# Patient Record
Sex: Female | Born: 1960 | Race: Black or African American | Hispanic: No | Marital: Married | Smoking: Never smoker
Health system: Southern US, Community
[De-identification: ages and names within clinical notes are randomized; demographics above are authoritative.]

## PROBLEM LIST (undated history)

## (undated) DIAGNOSIS — D649 Anemia, unspecified: Secondary | ICD-10-CM

## (undated) DIAGNOSIS — I6523 Occlusion and stenosis of bilateral carotid arteries: Secondary | ICD-10-CM

## (undated) DIAGNOSIS — Z87442 Personal history of urinary calculi: Secondary | ICD-10-CM

## (undated) DIAGNOSIS — E0500 Thyrotoxicosis with diffuse goiter without thyrotoxic crisis or storm: Secondary | ICD-10-CM

## (undated) HISTORY — PX: OTHER SURGICAL HISTORY: SHX169

## (undated) HISTORY — DX: Anemia, unspecified: D64.9

## (undated) HISTORY — DX: Occlusion and stenosis of bilateral carotid arteries: I65.23

---

## 2018-01-31 DIAGNOSIS — R002 Palpitations: Secondary | ICD-10-CM | POA: Diagnosis not present

## 2018-01-31 DIAGNOSIS — Z1211 Encounter for screening for malignant neoplasm of colon: Secondary | ICD-10-CM | POA: Diagnosis not present

## 2018-01-31 DIAGNOSIS — Z6820 Body mass index (BMI) 20.0-20.9, adult: Secondary | ICD-10-CM | POA: Diagnosis not present

## 2018-01-31 DIAGNOSIS — Z01419 Encounter for gynecological examination (general) (routine) without abnormal findings: Secondary | ICD-10-CM | POA: Diagnosis not present

## 2018-02-03 DIAGNOSIS — Z1231 Encounter for screening mammogram for malignant neoplasm of breast: Secondary | ICD-10-CM | POA: Diagnosis not present

## 2018-02-08 DIAGNOSIS — M898X9 Other specified disorders of bone, unspecified site: Secondary | ICD-10-CM | POA: Diagnosis not present

## 2018-02-08 DIAGNOSIS — Z0001 Encounter for general adult medical examination with abnormal findings: Secondary | ICD-10-CM | POA: Diagnosis not present

## 2018-02-08 DIAGNOSIS — H93A9 Pulsatile tinnitus, unspecified ear: Secondary | ICD-10-CM | POA: Diagnosis not present

## 2018-02-08 DIAGNOSIS — M2550 Pain in unspecified joint: Secondary | ICD-10-CM | POA: Diagnosis not present

## 2018-02-08 DIAGNOSIS — R002 Palpitations: Secondary | ICD-10-CM | POA: Diagnosis not present

## 2018-02-08 DIAGNOSIS — I517 Cardiomegaly: Secondary | ICD-10-CM | POA: Diagnosis not present

## 2018-02-08 DIAGNOSIS — Z0000 Encounter for general adult medical examination without abnormal findings: Secondary | ICD-10-CM | POA: Diagnosis not present

## 2018-02-08 DIAGNOSIS — R000 Tachycardia, unspecified: Secondary | ICD-10-CM | POA: Diagnosis not present

## 2018-02-08 LAB — HEMOGLOBIN A1C: Hemoglobin A1C: 11

## 2018-02-21 DIAGNOSIS — R634 Abnormal weight loss: Secondary | ICD-10-CM | POA: Diagnosis not present

## 2018-02-21 DIAGNOSIS — Z1211 Encounter for screening for malignant neoplasm of colon: Secondary | ICD-10-CM | POA: Diagnosis not present

## 2018-02-21 DIAGNOSIS — K5904 Chronic idiopathic constipation: Secondary | ICD-10-CM | POA: Diagnosis not present

## 2018-02-21 DIAGNOSIS — D509 Iron deficiency anemia, unspecified: Secondary | ICD-10-CM | POA: Diagnosis not present

## 2018-02-24 ENCOUNTER — Ambulatory Visit (INDEPENDENT_AMBULATORY_CARE_PROVIDER_SITE_OTHER): Payer: BLUE CROSS/BLUE SHIELD | Admitting: Internal Medicine

## 2018-02-24 ENCOUNTER — Encounter: Payer: Self-pay | Admitting: Internal Medicine

## 2018-02-24 VITALS — BP 128/60 | HR 116 | Ht 64.0 in | Wt 119.0 lb

## 2018-02-24 DIAGNOSIS — E559 Vitamin D deficiency, unspecified: Secondary | ICD-10-CM

## 2018-02-24 DIAGNOSIS — E0500 Thyrotoxicosis with diffuse goiter without thyrotoxic crisis or storm: Secondary | ICD-10-CM | POA: Insufficient documentation

## 2018-02-24 DIAGNOSIS — E0590 Thyrotoxicosis, unspecified without thyrotoxic crisis or storm: Secondary | ICD-10-CM | POA: Diagnosis not present

## 2018-02-24 LAB — TSH: TSH: <0.01 u[IU]/mL — ABNORMAL LOW (ref 0.35–4.50)

## 2018-02-24 LAB — T4, FREE: Free T4: 3.34 ng/dL — ABNORMAL HIGH (ref 0.60–1.60)

## 2018-02-24 LAB — T3, FREE: T3, Free: 17 pg/mL — ABNORMAL HIGH (ref 2.3–4.2)

## 2018-02-24 MED ORDER — ATENOLOL 25 MG PO TABS: 25.0000 mg | ORAL_TABLET | Freq: Every day | ORAL | 3 refills | Status: DC

## 2018-02-24 MED ORDER — ATENOLOL 25 MG PO TABS: 25.0000 mg | ORAL_TABLET | Freq: Every day | ORAL | 1 refills | Status: DC

## 2018-02-24 NOTE — Progress Notes (Signed)
Patient ID: Sandra Murray, female   DOB: 1960/11/28, 57 y.o.   MRN: [id number]    HPI  Sandra Murray is a 57 y.o.-year-old female, referred by her gastroenterologist, Dr. Loreta Ave, for evaluation for thyrotoxicosis.  She started to have some  weight loss and bone pain >> presented to see PCP  - TSH was undetectable. She was sent to a rheumatologist for suspicion for RA >> testing was negative.  No intervention was suggested for her abnormal thyroid tests then  4 mo ago she started to have: - palpitations - whooshing sound in year - lost weight (also very stressed as she is taking care of her father who has dementia) - tremors - anxiety - insomnia  She presented to see her current PCP (Dr. Leavy Cella) >> TSH was again undetectable. Again, no intervention was suggested.  She recently presented to see Dr. Loreta Ave for a screening colonoscopy >> Dr. Loreta Ave referred her to me urgently for her thyrotoxicosis.  I reviewed pt's thyroid tests per records from Dr. Loreta Ave: 02/08/2018: TSH <0.010 12/15/2016: TSH <0.010 12/18/2015: TSH 2.192 No results found for: TSH, FREET4, T3FREE  Antithyroid antibodies: No results found for: TSI  Pt denies: - feeling nodules in neck - dysphagia - choking - SOB with lying down She does have hoarseness.  Pt does not have a FH of thyroid ds. No FH of thyroid cancer. No h/o radiation tx to head or neck.  No seaweed or kelp, no recent contrast studies. No steroid use. No herbal supplements. No Biotin use.  I reviewed patient's latest CBC and she had a normal white blood cell count, at 7.6.  In 12/15/2016, this was also normal, at 6.2.  Of note, she also has a history of vitamin deficiency, with a level of 14 on 02/08/2018 and a level of 12 on 12/18/2015.  She also has ah/o anemia.  ROS: Constitutional: + see HPI Eyes: no blurry vision, no xerophthalmia ENT: no sore throat, + see HPI Cardiovascular: no CP/SOB/+ palpitations/no leg swelling Respiratory: no  cough/SOB Gastrointestinal: no N/V/D/C Musculoskeletal: + muscle aches/no joint aches Skin: no rashes Neurological: + tremors/no numbness/tingling/dizziness Psychiatric: no depression/+ anxiety  History reviewed. No pertinent past medical history.   History reviewed. No pertinent surgical history.   Social History   Socioeconomic History  . Marital status: Married    Spouse name: Not on file  . Number of children: 0  . Years of education: Not on file  . 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 Smoking status: Never Smoker  . Smokeless tobacco: Never Used  Substance and Sexual Activity  . Alcohol use: No  . Drug use: No   Not on any meds or supplements.  No Known Allergies   History reviewed. No pertinent family history.  Mother  - heart ds Father  - Prostate cancer  PE: BP 128/60   Pulse (!) 116   Ht 5\' 4"  (1.626 m) Comment: measured  Wt 119 lb (54 kg)   SpO2 98%   BMI 20.43 kg/m  Wt Readings from Last 3 Encounters:  02/24/18 119 lb (54 kg)   Constitutional:normal weight, in NAD Eyes: PERRLA, EOMI, no exophthalmos, no lid lag, no stare ENT: moist mucous membranes, no thyromegaly, no thyroid bruits, no cervical lymphadenopathy Cardiovascular: tachycardia, RR, No MRG Respiratory: CTA B Gastrointestinal: abdomen soft, NT, ND, BS+ Musculoskeletal: no deformities, strength intact in all 4 Skin: moist, warm, no rashes  Neurological: + tremor with outstretched hands, DTR normal in all 4  ASSESSMENT: 1. Thyrotoxicosis  2. Vitamin D def  PLAN:  1. Patient with a recently found low TSH, with thyrotoxic sxs: weight loss, palpitations, tremors, anxiety.  She has been thyrotoxic for at least 1 year and 3 months per review of labs obtained in 01/2018 and 11/2016.   - she does not appear to have exogenous causes for the low TSH.  - We discussed that possible causes of thyrotoxicosis are:  Marland Kitchen Graves ds    . Thyroiditis . toxic multinodular goiter/ toxic adenoma (I cannot feel nodules at palpation of her thyroid). - will check the TSH, fT3 and fT4 and also add thyroid stimulating antibodies to screen for Graves' disease.  - If the tests remain abnormal, we may need an uptake and scan to differentiate between the 3 above possible etiologies  - we discussed about possible modalities of treatment for the above conditions, to include methimazole use, radioactive iodine ablation or (last resort) surgery.  She is reticent about treatment since she was previously healthy and did not have to take medications for anything in the past. We discussed about possible dangers of persisting thyrotoxicosis: Arrhythmia, heart failure, hypercoagulability with the risk of stroke, PE, MI. - I explained we may need to do thyroid ultrasound depending on the results of the uptake and scan (if a cold nodule is present) >> for now, I advised to cancel the U/S suggested by PCP. - I suggested to add beta blockers (Atenolol) as she is tachycardic or tremulous. She refuses it for now as she would like to research the medication - she is reticent to start any med since she is not used to take any medications.  - she declined to join My chart - RTC in 3 months, but likely sooner for repeat labs  2. Vitamin D def - she was advised to start Ergocalciferol by PCP  - she did not start pending labs today - I advised her that she can start the vitamin D supplement   Addendum: Patient returned later in the day with her husband and wanted the atenolol sent to her pharmacy as she decided to start to take it right away.  Component     Latest Ref Rng & Units 02/24/2018  TSH     0.35 - 4.50 uIU/mL <0.01 Repeated and verified X2. (L)  T4,Free(Direct)     0.60 - 1.60 ng/dL 6.60 (H)  Triiodothyronine,Free,Serum     2.3 - 4.2 pg/mL 17.0 (H)  TSI     <140 % baseline 504 (H)   TFTs are in the thyrotoxic range and her TSI's are elevated  pointing towards Graves' disease as the etiology of her thyrotoxicosis.  We will start methimazole 10 mg twice a day.  I will have her back in 4 to 5 weeks for repeat labs.  Carlus Pavlov, MD PhD [facility]

## 2018-02-24 NOTE — Patient Instructions (Addendum)
Please stop at the lab.  Please start Atenolol 25 mg 1x a day at bedtime. Please increase the dose to 2x a day if the heart rate is >90 at home, at rest.  I will let you know about the results as soon as they return.  Please come back for a follow-up appointment in 3 months.   Hyperthyroidism Hyperthyroidism is when the thyroid is too active (overactive). Your thyroid is a large gland that is located in your neck. The thyroid helps to control how your body uses food (metabolism). When your thyroid is overactive, it produces too much of a hormone called thyroxine. What are the causes? Causes of hyperthyroidism may include:  Graves disease. This is when your immune system attacks the thyroid gland. This is the most common cause.  Inflammation of the thyroid gland.  Tumor in the thyroid gland or somewhere else.  Excessive use of thyroid medicines, including: ? Prescription thyroid supplement. ? Herbal supplements that mimic thyroid hormones.  Solid or fluid-filled lumps within your thyroid gland (thyroid nodules).  Excessive ingestion of iodine.  What increases the risk?  Being female.  Having a family history of thyroid conditions. What are the signs or symptoms? Signs and symptoms of hyperthyroidism may include:  Nervousness.  Inability to tolerate heat.  Unexplained weight loss.  Diarrhea.  Change in the texture of hair or skin.  Heart skipping beats or making extra beats.  Rapid heart rate.  Loss of menstruation.  Shaky hands.  Fatigue.  Restlessness.  Increased appetite.  Sleep problems.  Enlarged thyroid gland or nodules.  How is this diagnosed? Diagnosis of hyperthyroidism may include:  Medical history and physical exam.  Blood tests.  Ultrasound tests.  How is this treated? Treatment may include:  Medicines to control your thyroid.  Surgery to remove your thyroid.  Radiation therapy.  Follow these instructions at home:  Take  medicines only as directed by your health care provider.  Do not use any tobacco products, including cigarettes, chewing tobacco, or electronic cigarettes. If you need help quitting, ask your health care provider.  Do not exercise or do physical activity until your health care provider approves.  Keep all follow-up appointments as directed by your health care provider. This is important. Contact a health care provider if:  Your symptoms do not get better with treatment.  You have fever.  You are taking thyroid replacement medicine and you: ? Have depression. ? Feel mentally and physically slow. ? Have weight gain. Get help right away if:  You have decreased alertness or a change in your awareness.  You have abdominal pain.  You feel dizzy.  You have a rapid heartbeat.  You have an irregular heartbeat. This information is not intended to replace advice given to you by your health care provider. Make sure you discuss any questions you have with your health care provider. Document Released: 03/08/2005 Document Revised: 08/07/2015 Document Reviewed: 07/24/2013 Elsevier Interactive Patient Education  2018 [employer]

## 2018-02-27 ENCOUNTER — Telehealth: Payer: Self-pay | Admitting: Internal Medicine

## 2018-02-27 LAB — THYROID STIMULATING IMMUNOGLOBULIN: TSI: 504 % baseline — ABNORMAL HIGH (ref <140)

## 2018-02-27 MED ORDER — METHIMAZOLE 10 MG PO TABS: 10.0000 mg | ORAL_TABLET | Freq: Two times a day (BID) | ORAL | 1 refills | Status: DC

## 2018-02-27 NOTE — Telephone Encounter (Signed)
Patient has called inquiring about her lab results. Please Advise, thanks

## 2018-02-27 NOTE — Telephone Encounter (Signed)
Which is started atenolol which helps with her tachycardia.  If she persists in having tachycardia, she needs to increase the dose, as we discussed, to twice a day.  I am sure the antibodies will be back very soon.

## 2018-02-27 NOTE — Telephone Encounter (Signed)
The TSH is very low and the free T4 and free T3 are quite high.  I am still waiting for the Graves' antibodies to return.  I will let you know as soon as these return, to decide about further plan.

## 2018-02-27 NOTE — Telephone Encounter (Signed)
Called pt and made her aware. She is concerned. States during her last visit with you, sh had experienced some tachycardia. Would like to know if treatment for tachycardia is also on hold pending the Graves' results? Please advise.

## 2018-02-28 ENCOUNTER — Other Ambulatory Visit: Payer: Self-pay | Admitting: Internal Medicine

## 2018-02-28 ENCOUNTER — Other Ambulatory Visit: Payer: Self-pay

## 2018-02-28 ENCOUNTER — Telehealth: Payer: Self-pay

## 2018-02-28 NOTE — Telephone Encounter (Signed)
Please see prev. msg - Atenolol is for decreasing HR. She already started this. Can increase to bid if still high HR.

## 2018-02-28 NOTE — Telephone Encounter (Signed)
Made patient aware of MD advice and she stated an understanding

## 2018-02-28 NOTE — Telephone Encounter (Signed)
Gave patient results and she stated an understanding and agrees to this treatment-patient also reported elevated heart rate which she had the understanding Dr. Elvera LennoxGherghe was going to prescribe something for please advise

## 2018-02-28 NOTE — Telephone Encounter (Signed)
-----   Message from Carlus Pavlovristina Gherghe, MD sent at 02/27/2018  5:36 PM EST ----- Efraim KaufmannMelissa, can you please call pt: Patient's Graves' antibodies are back and are elevated.  It does appear that she has Graves' disease, the autoimmune disease that affects her thyroid.  In this case, my recommendation is to start methimazole 10 mg twice a day for the next month and then come back for labs.  I will order these.  I will also add methimazole to her list but I will not send it until you call her in the morning. Please advise her of the following: Please stop the Methimazole and call us or your primary care doctor if you develop: - sore throat - fever - yellow skin - dark urine - light colored stools As we will then need to check your blood counts and liver tests do to the possibility of a very rare side effect.

## 2018-02-28 NOTE — Telephone Encounter (Signed)
Made patient aware her medications were sent to her preferred pharmacy

## 2018-02-28 NOTE — Telephone Encounter (Signed)
Patient called upset stating the Nurse she spoke with this morning told her she would be filling her medication and did not do so. Patient was unable to provide name of RX. Please Advise, thanks

## 2018-03-01 MED ORDER — ATENOLOL 25 MG PO TABS: 25.0000 mg | ORAL_TABLET | Freq: Every day | ORAL | 1 refills | Status: DC

## 2018-03-01 MED ORDER — METHIMAZOLE 10 MG PO TABS: 10.0000 mg | ORAL_TABLET | Freq: Two times a day (BID) | ORAL | 1 refills | Status: DC

## 2018-03-01 NOTE — Telephone Encounter (Signed)
The medications were ordered, however instead of them being sent electronically to pharmacy they were ordered as "no print" which means they not only do not print but they also do not go to the pharmacy.  Medication has been sent.  Patient notified.

## 2018-03-07 DIAGNOSIS — D509 Iron deficiency anemia, unspecified: Secondary | ICD-10-CM | POA: Diagnosis not present

## 2018-03-07 DIAGNOSIS — R002 Palpitations: Secondary | ICD-10-CM | POA: Diagnosis not present

## 2018-03-07 DIAGNOSIS — E0500 Thyrotoxicosis with diffuse goiter without thyrotoxic crisis or storm: Secondary | ICD-10-CM | POA: Diagnosis not present

## 2018-03-08 DIAGNOSIS — R000 Tachycardia, unspecified: Secondary | ICD-10-CM | POA: Diagnosis not present

## 2018-03-08 DIAGNOSIS — R002 Palpitations: Secondary | ICD-10-CM | POA: Diagnosis not present

## 2018-03-09 ENCOUNTER — Ambulatory Visit (INDEPENDENT_AMBULATORY_CARE_PROVIDER_SITE_OTHER): Payer: BLUE CROSS/BLUE SHIELD | Admitting: Neurology

## 2018-03-09 ENCOUNTER — Encounter: Payer: Self-pay | Admitting: Neurology

## 2018-03-09 VITALS — BP 144/67 | HR 82 | Ht 64.0 in | Wt 121.0 lb

## 2018-03-09 DIAGNOSIS — E0590 Thyrotoxicosis, unspecified without thyrotoxic crisis or storm: Secondary | ICD-10-CM

## 2018-03-09 DIAGNOSIS — R0989 Other specified symptoms and signs involving the circulatory and respiratory systems: Secondary | ICD-10-CM | POA: Diagnosis not present

## 2018-03-09 NOTE — Progress Notes (Signed)
Reason for visit: Thyroid toxicosis  Referring physician: Dr. Lorin Glassillard  Sandra Murray is a 57 y.o. female  History of present illness:  Sandra Murray is a 57 year old right-handed black female with a history of hyperthyroidism.  The patient has been seen through endocrinology and she has been felt to have Graves' disease.  The patient has noted some issues over the last year or 2, she has had some gradual weight loss, she has lost 18 pounds in the last year.  The patient has had some fine tremors of the hands, she has had elevated heart rate and palpitations of the heart.  She has denied any weakness but she has fallen on occasion.  She denies significant issues controlling the bowels or the bladder but she has had one event of bowel and bladder incontinence.  The patient denies headaches or dizziness.  She does report some pulsatile tinnitus that has been noted since September 2019.  The patient was found to have a very low TSH and elevated T4 and T3 levels.  She is now followed through endocrinology, on methimazole.  The patient is sent to this office for an evaluation.  She has already been set up for a carotid Doppler study.  History reviewed. No pertinent past medical history.  History reviewed. No pertinent surgical history.  Family History  Problem Relation Age of Onset  . Stroke Mother   . Heart disease Mother   . Alzheimer's disease Father     Social history:  reports that she has never smoked. She has never used smokeless tobacco. She reports that she does not drink alcohol or use drugs.  Medications:  Prior to Admission medications   Medication Sig Start Date End Date Taking? Authorizing Provider  atenolol (TENORMIN) 25 MG tablet Take 1 tablet (25 mg total) by mouth daily. 03/01/18  Yes Carlus PavlovGherghe, Cristina, MD  ergocalciferol (VITAMIN D2) 1.25 MG (50000 UT) capsule Take 50,000 Units by mouth once a week. 1 weekly for 3 months   Yes [provider]  methimazole  (TAPAZOLE) 10 MG tablet Take 1 tablet (10 mg total) by mouth 2 (two) times daily. With meals. 03/01/18  Yes Carlus PavlovGherghe, Cristina, MD     No Known Allergies  ROS:  Out of a complete 14 system review of symptoms, the patient complains only of the following symptoms, and all other reviewed systems are negative.  Weight loss Palpitations of the heart Shortness of breath Constipation Anemia Joint pain, aching muscles Tremor Not enough sleep, insomnia  Blood pressure (!) 144/67, pulse 82, height 5\' 4"  (1.626 m), weight 121 lb (54.9 kg).  Physical Exam  General: The patient is alert and cooperative at the time of the examination.  Eyes: Pupils are equal, round, and reactive to light. Discs are flat bilaterally.  Neck: The neck is supple, bilateral carotid bruits are noted.  Respiratory: The respiratory examination is clear.  Cardiovascular: The cardiovascular examination reveals a regular rate and rhythm, no obvious murmurs or rubs are noted.  Skin: Extremities are without significant edema.  Neurologic Exam  Mental status: The patient is alert and oriented x 3 at the time of the examination. The patient has apparent normal recent and remote memory, with an apparently normal attention span and concentration ability.  Cranial nerves: Facial symmetry is present. There is good sensation of the face to pinprick and soft touch bilaterally. The strength of the facial muscles and the muscles to head turning and shoulder shrug are normal bilaterally. Speech is  well enunciated, no aphasia or dysarthria is noted. Extraocular movements are full. Visual fields are full. The tongue is midline, and the patient has symmetric elevation of the soft palate. No obvious hearing deficits are noted.  Motor: The motor testing reveals 5 over 5 strength of all 4 extremities, with exception of slight weakness of the triceps muscles and with hip flexion bilaterally. Good symmetric motor tone is noted  throughout.  Sensory: Sensory testing is intact to pinprick, soft touch, vibration sensation, and position sense on all 4 extremities. No evidence of extinction is noted.  Coordination: Cerebellar testing reveals good finger-nose-finger and heel-to-shin bilaterally.  A slight elevation the physiologic tremor is noted.  Gait and station: Gait is normal. Tandem gait is normal. Romberg is negative. No drift is seen.  Reflexes: Deep tendon reflexes are symmetric and normal bilaterally. Toes are downgoing bilaterally.   Assessment/Plan:  1.  Hyperthyroidism, Graves' disease  2.  Bilateral carotid bruits  The patient has already been set up for a carotid Doppler study.  The patient likely has bilateral carotid bruits associated with an elevation in cardiac output, this may resolve with full treatment of the hyperthyroidism.  The patient will follow-up through this office if needed.  Marlan Palau. Keith  MD 03/09/2018 8:57 AM  [facility] [address] [place], [place] [id number]  Phone [phone] Fax [phone]

## 2018-03-10 DIAGNOSIS — R000 Tachycardia, unspecified: Secondary | ICD-10-CM | POA: Diagnosis not present

## 2018-03-10 DIAGNOSIS — R002 Palpitations: Secondary | ICD-10-CM | POA: Diagnosis not present

## 2018-04-18 DIAGNOSIS — R7989 Other specified abnormal findings of blood chemistry: Secondary | ICD-10-CM | POA: Diagnosis not present

## 2018-04-21 DIAGNOSIS — D509 Iron deficiency anemia, unspecified: Secondary | ICD-10-CM | POA: Diagnosis not present

## 2018-04-21 DIAGNOSIS — I10 Essential (primary) hypertension: Secondary | ICD-10-CM | POA: Diagnosis not present

## 2018-05-18 DIAGNOSIS — D509 Iron deficiency anemia, unspecified: Secondary | ICD-10-CM | POA: Diagnosis not present

## 2018-05-18 DIAGNOSIS — Z1211 Encounter for screening for malignant neoplasm of colon: Secondary | ICD-10-CM | POA: Diagnosis not present

## 2018-05-18 DIAGNOSIS — R194 Change in bowel habit: Secondary | ICD-10-CM | POA: Diagnosis not present

## 2018-06-07 ENCOUNTER — Ambulatory Visit: Payer: BLUE CROSS/BLUE SHIELD | Admitting: Internal Medicine

## 2018-06-08 ENCOUNTER — Other Ambulatory Visit: Payer: Self-pay

## 2018-06-09 ENCOUNTER — Ambulatory Visit (INDEPENDENT_AMBULATORY_CARE_PROVIDER_SITE_OTHER): Payer: 59 | Admitting: Internal Medicine

## 2018-06-09 ENCOUNTER — Encounter: Payer: Self-pay | Admitting: Internal Medicine

## 2018-06-09 ENCOUNTER — Other Ambulatory Visit: Payer: Self-pay

## 2018-06-09 VITALS — BP 130/70 | HR 64 | Temp 98.1°F | Ht 64.0 in | Wt 133.0 lb

## 2018-06-09 DIAGNOSIS — E0500 Thyrotoxicosis with diffuse goiter without thyrotoxic crisis or storm: Secondary | ICD-10-CM | POA: Diagnosis not present

## 2018-06-09 DIAGNOSIS — E0590 Thyrotoxicosis, unspecified without thyrotoxic crisis or storm: Secondary | ICD-10-CM

## 2018-06-09 LAB — TSH: TSH: 46.86 u[IU]/mL — ABNORMAL HIGH (ref 0.35–4.50)

## 2018-06-09 LAB — T3, FREE: T3, Free: 2 pg/mL — ABNORMAL LOW (ref 2.3–4.2)

## 2018-06-09 LAB — T4, FREE: Free T4: <0.25 ng/dL — ABNORMAL LOW (ref 0.60–1.60)

## 2018-06-09 MED ORDER — METHIMAZOLE 5 MG PO TABS: 5.0000 mg | ORAL_TABLET | Freq: Every day | ORAL | 5 refills | Status: DC

## 2018-06-09 NOTE — Patient Instructions (Addendum)
Please stop at the lab.  Please continue: - Methimazole 10 mg 2x a day with meals  Please decrease: - Atenolol to 12mg  1x a day at bedtime - for 2 weeks. If your pulse stays <90 at rest, stop Atenolol completely  Please come back for a follow-up appointment in 4 months.

## 2018-06-09 NOTE — Progress Notes (Signed)
Patient ID: Bissie Dingee, female   DOB: July 14, 1960, 58 y.o.   MRN: [id number]    HPI  Cheyeanne Jarman Manson Passey is a 58 y.o.-year-old female, initially referred by her gastroenterologist, Dr. Loreta Ave, now returning for follow-up for Graves' disease.  Reviewed and addended history: She started to have some  weight loss and bone pain in 2018 >> presented to see PCP  - TSH was undetectable. She was sent to a rheumatologist for suspicion for RA >> testing was negative.  No intervention was suggested for her abnormal thyroid tests then.  In 10/2017, she started to have: - palpitations - whooshing sound in year - lost weight (also very stressed as she is taking care of her father who has dementia) - tremors - anxiety - insomnia  She presented to see her current PCP (Dr. Leavy Cella) >> TSH was again undetectable.  Again, no intervention was suggested.  She then saw Dr. Loreta Ave for a screening colonoscopy >> Dr. Loreta Ave referred her to me urgently for her thyrotoxicosis.  I reviewed patient's thyroid tests per epic records and also records from Dr. Loreta Ave: Lab Results  Component Value Date   TSH <0.01 Repeated and verified X2. (L) 02/24/2018   FREET4 3.34 (H) 02/24/2018   T3FREE 17.0 (H) 02/24/2018  02/08/2018: TSH <0.010 12/15/2016: TSH <0.010 12/18/2015: TSH 2.192  Her Graves' antibodies were elevated: Lab Results  Component Value Date   TSI 504 (H) 02/24/2018   At last visit, we started: - MMI 10 mg 2x a day - Atenolol 25 mg daily  She did not return for repeat TFTs 1 month after starting the medication (miscommunication?).  However, she tells me that she is feeling much better after starting the medications.  She gained weight (intentional), does not have the whooshing sound in her ears, no palpitations, no tremors.  Pt denies: - feeling nodules in neck - hoarseness - dysphagia - choking - SOB with lying down  Pt does not have a FH of thyroid ds. No FH of thyroid cancer. No h/o radiation  tx to head or neck.  No recent contrast studies. No herbal supplements. No Biotin use. No recent steroids use.   I reviewed patient's latest CBC and she had a normal white blood cell count, at 7.6.  In 12/15/2016, this was also normal, at 6.2.  Of note, she also has a history of vitamin deficiency, with a level of 14 on 02/08/2018 and a level of 12 on 12/18/2015.  She also has a history of anemia.  ROS: Constitutional: + weight gain/no weight loss, no fatigue, no subjective hyperthermia, no subjective hypothermia Eyes: no blurry vision, no xerophthalmia ENT: no sore throat, + see HPI Cardiovascular: no CP/no SOB/no palpitations/no leg swelling Respiratory: no cough/no SOB/no wheezing Gastrointestinal: no N/no V/no D/no C/no acid reflux Musculoskeletal: no muscle aches/no joint aches Skin: no rashes, no hair loss Neurological: no tremors/no numbness/no tingling/no dizziness  I reviewed pt's medications, allergies, PMH, social hx, family hx, and changes were documented in the history of present illness. Otherwise, unchanged from my initial visit note. PMH: -See above  No past surgical history  Social History   Socioeconomic History  . Marital status: Married    Spouse name: Not on file  . Number of children: 0  . Years of education: Not on file  . 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 Smoking status: Never Smoker  . Smokeless tobacco: Never Used  Substance and Sexual Activity  . Alcohol use: No  . Drug use: No   Current Outpatient Medications  Medication Sig Dispense Refill  . ergocalciferol (VITAMIN D2) 1.25 MG (50000 UT) capsule Take 50,000 Units by mouth once a week. 1 weekly for 3 months    . methimazole (TAPAZOLE) 10 MG tablet Take 1 tablet (10 mg total) by mouth 2 (two) times daily. With meals. 120 tablet 1   No current facility-administered medications for this visit.     No Known Allergies    Family History  Problem Relation Age of Onset  . Stroke Mother   . Heart disease Mother   . Alzheimer's disease Father     Mother  - heart ds Father  - Prostate cancer  PE: BP 130/70   Pulse 64   Temp 98.1 F (36.7 C)   Ht 5\' 4"  (1.626 m)   Wt 133 lb (60.3 kg)   SpO2 99%   BMI 22.83 kg/m  Wt Readings from Last 3 Encounters:  06/09/18 133 lb (60.3 kg)  03/09/18 121 lb (54.9 kg)  02/24/18 119 lb (54 kg)   Constitutional: Normal weight, in NAD Eyes: PERRLA, EOMI, no exophthalmos, no lid lag, no stare ENT: moist mucous membranes, no thyromegaly, no cervical lymphadenopathy Cardiovascular: RRR, No MRG Respiratory: CTA B Gastrointestinal: abdomen soft, NT, ND, BS+ Musculoskeletal: no deformities, strength intact in all 4 Skin: moist, warm, no rashes Neurological: no tremor with outstretched hands, DTR normal in all 4  ASSESSMENT: 1. Thyrotoxicosis  PLAN:  1. Patient with a history of a low TSH with thyrotoxic symptoms: Weight loss, palpitations, tremors, anxiety.  She has had thyrotoxic TFTs for at least 1 year and 3 months per review of the chart when I last saw her, first occurrence 11/2016. -At last visit, I explained the consequences of uncontrolled thyrotoxicosis: Arrhythmia, heart failure, hypercoagulability with higher risk of stroke, PE, MI.  I strongly suggested that we get this under control. -TSI antibodies were elevated pointing towards a diagnosis of Graves' disease. -We started her on 10 mg of methimazole 2x a day and also added atenolol 25 mg daily as she was tachycardic and tremulous.  However, she did not return for repeat labs in 1 month (unclear if there was initial miscommunication between our office and the patient).  However, she is feeling very well on the above regimen and she is compliant with the recommended doses.  Specifically, she does not have palpitations, tremors, she gained 12 pounds, which was intentional, has more energy, and overall feels  much better. -We discussed about her diagnosis of Graves' disease.  I explained that this is an autoimmune disease that stimulates her thyroid to over produce hormones. - possible modalities for treatment to include continuing methimazole and adjusting the dose down hopefully, RAI treatment, or, last resort, thyroidectomy.  For now, will continue methimazole -At today's visit, we will check her TFTs and adjust the medication dose as needed.  As her pulse is 64 today, we discussed about reducing the atenolol to 12.5 mg daily for now and stop after 2 weeks if her pulse is normal  - time spent with the patient: 25 minutes, of which >50% was spent in obtaining information about her symptoms, reviewing her previous labs, evaluations, and treatments, counseling her about her condition (please see the discussed topics above), and developing a plan to further investigate and treat it; she had a number of questions which I addressed.  Component     Latest  Ref Rng & Units 06/09/2018  TSH     0.35 - 4.50 uIU/mL 46.86 (H)  T4,Free(Direct)     0.60 - 1.60 ng/dL <8.65 (L)  Triiodothyronine,Free,Serum     2.3 - 4.2 pg/mL 2.0 (L)  Tests are now in the hypothyroid range - probably 2/2 longer time on the higher MMI dose >> will decrease the methimazole to 5 mg daily and have her back for labs in 4-5 weeks.   Carlus Pavlov, MD PhD [facility] Endocrinology

## 2018-06-27 ENCOUNTER — Other Ambulatory Visit (INDEPENDENT_AMBULATORY_CARE_PROVIDER_SITE_OTHER): Payer: 59

## 2018-06-27 ENCOUNTER — Other Ambulatory Visit: Payer: Self-pay

## 2018-06-27 ENCOUNTER — Other Ambulatory Visit: Payer: Self-pay | Admitting: Internal Medicine

## 2018-06-27 DIAGNOSIS — E0500 Thyrotoxicosis with diffuse goiter without thyrotoxic crisis or storm: Secondary | ICD-10-CM

## 2018-06-27 LAB — T3, FREE: T3, Free: 2.4 pg/mL (ref 2.3–4.2)

## 2018-06-27 LAB — TSH: TSH: 45.41 u[IU]/mL — ABNORMAL HIGH (ref 0.35–4.50)

## 2018-06-27 LAB — T4, FREE: Free T4: <0.25 ng/dL — ABNORMAL LOW (ref 0.60–1.60)

## 2018-07-17 ENCOUNTER — Telehealth: Payer: Self-pay | Admitting: Internal Medicine

## 2018-07-17 NOTE — Telephone Encounter (Signed)
M, please have her come for labs - I had to stop her MMI recently b/c she became hypothyroid but let's see if the tests changed. Ty!

## 2018-07-17 NOTE — Telephone Encounter (Signed)
Patient states Dr.Gherghe had her stop taking methimazole 5MG  and atenolol half of and now her heart rate is over 90 and the muscle aches are back.  Please Advise, Thanks

## 2018-07-19 NOTE — Telephone Encounter (Signed)
Called pt and made her aware. Scheduled for lab draw 07/21/18

## 2018-07-21 ENCOUNTER — Other Ambulatory Visit: Payer: Self-pay

## 2018-07-21 ENCOUNTER — Other Ambulatory Visit (INDEPENDENT_AMBULATORY_CARE_PROVIDER_SITE_OTHER): Payer: 59

## 2018-07-21 DIAGNOSIS — E0500 Thyrotoxicosis with diffuse goiter without thyrotoxic crisis or storm: Secondary | ICD-10-CM

## 2018-07-21 LAB — T4, FREE: Free T4: 0.75 ng/dL (ref 0.60–1.60)

## 2018-07-21 LAB — T3, FREE: T3, Free: 4.7 pg/mL — ABNORMAL HIGH (ref 2.3–4.2)

## 2018-07-21 LAB — TSH: TSH: 0.47 u[IU]/mL (ref 0.35–4.50)

## 2018-07-25 ENCOUNTER — Other Ambulatory Visit: Payer: Self-pay | Admitting: Internal Medicine

## 2018-07-25 ENCOUNTER — Encounter: Payer: Self-pay | Admitting: Internal Medicine

## 2018-07-25 DIAGNOSIS — E0500 Thyrotoxicosis with diffuse goiter without thyrotoxic crisis or storm: Secondary | ICD-10-CM

## 2018-07-26 ENCOUNTER — Other Ambulatory Visit: Payer: Self-pay | Admitting: Internal Medicine

## 2018-07-26 MED ORDER — METHIMAZOLE 5 MG PO TABS: 5.0000 mg | ORAL_TABLET | Freq: Every day | ORAL | 3 refills | Status: DC

## 2018-07-27 ENCOUNTER — Other Ambulatory Visit: Payer: Self-pay | Admitting: Internal Medicine

## 2018-07-27 MED ORDER — METHIMAZOLE 5 MG PO TABS: 5.0000 mg | ORAL_TABLET | Freq: Every day | ORAL | 3 refills | Status: DC

## 2018-08-16 ENCOUNTER — Other Ambulatory Visit: Payer: Self-pay | Admitting: Internal Medicine

## 2018-08-24 ENCOUNTER — Telehealth: Payer: Self-pay | Admitting: Internal Medicine

## 2018-08-24 NOTE — Telephone Encounter (Signed)
Patient called re: patient is seeing double. Instructed patient to call her Opthalmologist-per Dr. Elvera Lennox

## 2018-10-05 ENCOUNTER — Other Ambulatory Visit: Payer: Self-pay

## 2018-10-09 ENCOUNTER — Encounter: Payer: Self-pay | Admitting: Internal Medicine

## 2018-10-09 ENCOUNTER — Ambulatory Visit (INDEPENDENT_AMBULATORY_CARE_PROVIDER_SITE_OTHER): Payer: 59 | Admitting: Internal Medicine

## 2018-10-09 ENCOUNTER — Other Ambulatory Visit: Payer: Self-pay

## 2018-10-09 VITALS — BP 130/70 | HR 78 | Ht 64.0 in | Wt 135.4 lb

## 2018-10-09 DIAGNOSIS — E559 Vitamin D deficiency, unspecified: Secondary | ICD-10-CM

## 2018-10-09 DIAGNOSIS — E0500 Thyrotoxicosis with diffuse goiter without thyrotoxic crisis or storm: Secondary | ICD-10-CM

## 2018-10-09 LAB — VITAMIN D 25 HYDROXY (VIT D DEFICIENCY, FRACTURES): VITD: 28.27 ng/mL — ABNORMAL LOW (ref 30.00–100.00)

## 2018-10-09 LAB — T4, FREE: Free T4: 0.4 ng/dL — ABNORMAL LOW (ref 0.60–1.60)

## 2018-10-09 LAB — TSH: TSH: 15.86 u[IU]/mL — ABNORMAL HIGH (ref 0.35–4.50)

## 2018-10-09 LAB — T3, FREE: T3, Free: 2.9 pg/mL (ref 2.3–4.2)

## 2018-10-09 NOTE — Patient Instructions (Signed)
Please stop at the lab.  Please continue Methimazole 5 mg daily.  Please continue vitamin D 2000 mg 3x a week.  I will refer you to Dr. Ramonita Lab at [facility].  Please come back for a follow-up appointment in 6 months.

## 2018-10-09 NOTE — Progress Notes (Addendum)
Patient ID: Sandra Murray, female   DOB: [birth year], 58 y.o.   MRN: [id number]    HPI  Sandra Murray is a 58 y.o.-year-old female, initially referred by her gastroenterologist, Dr. Loreta Ave, returning for follow-up for Graves' disease.  Last visit 4 months ago.  Addended history: She started to have some  weight loss and bone pain in 2018 >> presented to see PCP  - TSH was undetectable. She was sent to a rheumatologist for suspicion for RA >> testing was negative.  No intervention was suggested for her abnormal thyroid tests then.  In 10/2017, she started to have: - palpitations - whooshing sound in year - lost weight (also very stressed as she is taking care of her father who has dementia) - tremors - anxiety - insomnia  She presented to see her current PCP (Dr. Leavy Cella) >> TSH was again undetectable.  Again, no intervention was suggested.  She then saw Dr. Loreta Ave for a screening colonoscopy >> Dr. Loreta Ave referred her to me urgently for her thyrotoxicosis.  We initially started: -Methimazole 10 mg 2x a day -Atenolol 25 mg daily She did not return for repeat TFTs 1 month after starting the medication (miscommunication?).  However, when she returned to the clinic for another she was telling me that she felt much better.  She gained weight, did not have palpitations or tremors.  However, at that time the TSH was very high, at 46.86.  We decrease the methimazole to 5 mg daily and ended up stopping it completely in 06/2018.  At that time, we also started to taper down atenolol and then stop completely.  Subsequent TFTs were normal in 07/2018, however, she called [place] that she started to have hyperthyroid symptoms again and we restarted a low-dose methimazole, 5 mg daily.  She called [place] in 08/2018 for diplopia and I directed her to her ophthalmologist.  She actually saw an optometrist who noticed an abnormality in her gaze and told her that this is possibly related to the thyroid condition, but  did not suggest any other follow-up or treatment.  She continues to have diplopia especially when looks up.  Reviewed patient's TFTs: Lab Results  Component Value Date   TSH 0.47 07/21/2018   TSH 45.41 (H) 06/27/2018   TSH 46.86 (H) 06/09/2018   TSH <0.01 Repeated and verified X2. (L) 02/24/2018   FREET4 0.75 07/21/2018   FREET4 <0.25 (L) 06/27/2018   FREET4 <0.25 (L) 06/09/2018   FREET4 3.34 (H) 02/24/2018   T3FREE 4.7 (H) 07/21/2018   T3FREE 2.4 06/27/2018   T3FREE 2.0 (L) 06/09/2018   T3FREE 17.0 (H) 02/24/2018  02/08/2018: TSH <0.010 12/15/2016: TSH <0.010 12/18/2015: TSH 2.192  Her Graves' antibodies were elevated: Lab Results  Component Value Date   TSI 504 (H) 02/24/2018   Pt denies: - feeling nodules in neck - hoarseness - dysphagia - choking - SOB with lying down  Pt does not have a FH of thyroid ds. No FH of thyroid cancer. No h/o radiation tx to head or neck.  No seaweed or kelp. No recent contrast studies. No herbal supplements. No Biotin use. No recent steroids use.   Of note, she also has a history of vitamin deficiency with a level of 14 on 02/08/2018 and a level of 12 on 12/18/2015. No results found for: VD25OH    She is on ergocalciferol 50,000 units weekly.  She also has a history of anemia.  ROS: Constitutional: no weight gain/no weight loss, no fatigue, no  subjective hyperthermia, no subjective hypothermia Eyes: no blurry vision, no xerophthalmia ENT: no sore throat, + see HPI Cardiovascular: no CP/no SOB/no palpitations/no leg swelling Respiratory: no cough/no SOB/no wheezing Gastrointestinal: no N/no V/no D/no C/no acid reflux Musculoskeletal: no muscle aches/no joint aches Skin: no rashes, no hair loss Neurological: no tremors/no numbness/no tingling/no dizziness  I reviewed pt's medications, allergies, PMH, social hx, family hx, and changes were documented in the history of present illness. Otherwise, unchanged from my initial visit  note.  PMH: -See above  No past surgical history  Social History   Socioeconomic History  . Marital status: Married    Spouse name: Not on file  . Number of children: 0  . Years of education: Not on file  . 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 Smoking status: Never Smoker  . Smokeless tobacco: Never Used  Substance and Sexual Activity  . Alcohol use: No  . Drug use: No   Current Outpatient Medications  Medication Sig Dispense Refill  . ergocalciferol (VITAMIN D2) 1.25 MG (50000 UT) capsule Take 50,000 Units by mouth once a week. 1 weekly for 3 months    . methimazole (TAPAZOLE) 5 MG tablet Take 1 tablet (5 mg total) by mouth daily. 60 tablet 3   No current facility-administered medications for this visit.     No Known Allergies   Family History  Problem Relation Age of Onset  . Stroke Mother   . Heart disease Mother   . Alzheimer's disease Father     Mother  - heart ds Father  - Prostate cancer  PE: BP 130/70 (BP Location: Left Arm, Patient Position: Sitting, Cuff Size: Normal)   Pulse 78   Ht 5\' 4"  (1.626 m)   Wt 135 lb 6.4 oz (61.4 kg)   SpO2 99%   BMI 23.24 kg/m  Wt Readings from Last 3 Encounters:  10/09/18 135 lb 6.4 oz (61.4 kg)  06/09/18 133 lb (60.3 kg)  03/09/18 121 lb (54.9 kg)   Constitutional: normal weight, in NAD Eyes: PERRLA, EOMI,+ exophthalmos, + stare ENT: moist mucous membranes, no thyromegaly, no cervical lymphadenopathy Cardiovascular: RRR, No MRG Respiratory: CTA B Gastrointestinal: abdomen soft, NT, ND, BS+ Musculoskeletal: no deformities, strength intact in all 4 Skin: moist, warm, no rashes Neurological: no tremor with outstretched hands, DTR normal in all 4  ASSESSMENT: 1. Graves ds.  2.  Diplopia  3. Vit D deficiency  PLAN:  1. Patient with history of low TSH (confirmed as Graves' disease based on high TSI antibodies), with initially thyrotoxic  symptoms: Weight loss, palpitations, tremors, anxiety.  Reviewing her chart, she has had thyrotoxic TFTs for at least 1 year and 3 months before our first visit, with first occurrence in 11/2017.  We discussed at length in the past about possible consequences of uncontrolled thyrotoxicosis: Arrhythmia, heart failure, hypercoagulability with increased risk of stroke, PE, MI.  I strongly suggested to get this under control.  She agreed and we started 10 mg of methimazole twice a day and also added atenolol 25 mg daily.  However, she did not return for repeat labs in 1 month due to miscommunication with our office but she was feeling very well on the above regimen and she was compliant with the doses.  At last visit her palpitations, tremors, weight loss, fatigue have all resolved.  She had gained 12 pounds, which was welcomed by the patient.  However, we  checked her TFTs at that time and her TSH was very high, at 47.  I advised her to decrease the methimazole dose to only 5 mg daily and then we ended up stopping the medication completely 06/2018.  Her TFTs normalized to 07/2018, however, she called complaining of symptoms reminiscent of her thyrotoxicosis and we started a low-dose methimazole, 5 mg once daily.  Approximately a month ago she also called with diplopia and I advised her to immediately call for ophthalmologist.  She saw an optometrist who acknowledged that the changes could be related to her Graves' disease but did not suggest any further action -also see below.   -She was able to come off atenolol after our last visit.  She is not tachycardic today -At this visit, she continues methimazole 5 mg daily -She feels good on this dose and has no side effects.  -We discussed about possible modalities for treatment for Graves' disease to include continuing methimazole, RAI treatment, or, last resort, thyroidectomy.  For now, we decided to continue methimazole. -She is not tachycardic today so no need to  restart the beta-blocker -I will see her back in 4 months, but most likely for labs sooner  2.  Diplopia -With upward gaze -She also has exophthalmos, but no chemosis, eye pain -We will check her TSI's today -will refer her to [facility] R[facility] at [facility]  3. Vit D def -She had a low vitamin D level in the past, last level from 01/2018 -Previously on supplementation with ergocalciferol 50,000 units once a week, but she was changed to 2000 mg 3 times a week by PCP.  She will return to see her PCP in a year. -We will recheck her level today  Orders Placed This Encounter  Procedures  . TSH  . T4, free  . T3, free  . Thyroid stimulating immunoglobulin  . Vitamin D, 25-hydroxy  . Ambulatory referral to Ophthalmology   Component     Latest Ref Rng & Units 10/09/2018  TSH     0.35 - 4.50 uIU/mL 15.86 (H)  T4,Free(Direct)     0.60 - 1.60 ng/dL 1.61 (L)  Triiodothyronine,Free,Serum     2.3 - 4.2 pg/mL 2.9  VITD     30.00 - 100.00 ng/mL 28.27 (L)   Component     Latest Ref Rng & Units 10/09/2018  TSI     <140 % baseline 389 (H)   Vitamin D level is slightly low so I will advise her to start 2000 units vitamin D daily.  We will check the level at next visit. Her TSH is elevated with a low free T4 so at this point we have to stop MMI and have her come back for labs in 4 to 5 weeks.  Patient sent me the following message after the visit: After reviewing the OTC supplements I'm taking,I needed to make a correction and provide additional information.  Regarding the Vitamin D, I take D3 2000 IU daily, I also take vitamin c 500mg  3x weekly and vitamin B12 500 mcg daily.  Some days I do forget to take.  I will advise her to increase her vitamin D to 4000 units daily.  Carlus Pavlov, MD PhD [facility] Endocrinology

## 2018-10-11 ENCOUNTER — Encounter: Payer: Self-pay | Admitting: Internal Medicine

## 2018-10-12 LAB — THYROID STIMULATING IMMUNOGLOBULIN: TSI: 389 % baseline — ABNORMAL HIGH (ref <140)

## 2018-10-13 ENCOUNTER — Encounter: Payer: Self-pay | Admitting: Internal Medicine

## 2018-11-03 ENCOUNTER — Other Ambulatory Visit: Payer: Self-pay

## 2018-11-03 DIAGNOSIS — Z20822 Contact with and (suspected) exposure to covid-19: Secondary | ICD-10-CM

## 2018-11-05 LAB — NOVEL CORONAVIRUS, NAA: SARS-CoV-2, NAA: DETECTED — AB

## 2019-01-25 ENCOUNTER — Encounter: Payer: Self-pay | Admitting: Internal Medicine

## 2019-01-26 ENCOUNTER — Encounter (HOSPITAL_COMMUNITY): Payer: Self-pay

## 2019-01-26 ENCOUNTER — Other Ambulatory Visit: Payer: Self-pay

## 2019-01-26 ENCOUNTER — Emergency Department (HOSPITAL_COMMUNITY)
Admission: EM | Admit: 2019-01-26 | Discharge: 2019-01-26 | Disposition: A | Discharge status: Home / Self Care | Payer: 59 | Attending: Emergency Medicine | Admitting: Emergency Medicine

## 2019-01-26 ENCOUNTER — Emergency Department (HOSPITAL_COMMUNITY): Payer: 59

## 2019-01-26 DIAGNOSIS — Z5321 Procedure and treatment not carried out due to patient leaving prior to being seen by health care provider: Secondary | ICD-10-CM | POA: Insufficient documentation

## 2019-01-26 DIAGNOSIS — R000 Tachycardia, unspecified: Secondary | ICD-10-CM | POA: Diagnosis present

## 2019-01-26 HISTORY — DX: Thyrotoxicosis with diffuse goiter without thyrotoxic crisis or storm: E05.00

## 2019-01-26 LAB — BASIC METABOLIC PANEL
Anion gap: 14 (ref 5–15)
BUN: 12 mg/dL (ref 6–20)
CO2: 21 mmol/L — ABNORMAL LOW (ref 22–32)
Calcium: 9.5 mg/dL (ref 8.9–10.3)
Chloride: 102 mmol/L (ref 98–111)
Creatinine, Ser: 0.53 mg/dL (ref 0.44–1.00)
GFR calc Af Amer: >60 mL/min (ref >60)
GFR calc non Af Amer: >60 mL/min (ref >60)
Glucose, Bld: 79 mg/dL (ref 70–99)
Potassium: 3.7 mmol/L (ref 3.5–5.1)
Sodium: 137 mmol/L (ref 135–145)

## 2019-01-26 LAB — CBC
HCT: 36.4 % (ref 36.0–46.0)
Hemoglobin: 11.1 g/dL — ABNORMAL LOW (ref 12.0–15.0)
MCH: 24.4 pg — ABNORMAL LOW (ref 26.0–34.0)
MCHC: 30.5 g/dL (ref 30.0–36.0)
MCV: 80 fL (ref 80.0–100.0)
Platelets: 257 10*3/uL (ref 150–400)
RBC: 4.55 MIL/uL (ref 3.87–5.11)
RDW: 14.1 % (ref 11.5–15.5)
WBC: 8.6 10*3/uL (ref 4.0–10.5)
nRBC: 0 % (ref 0.0–0.2)

## 2019-01-26 LAB — TROPONIN I (HIGH SENSITIVITY): Troponin I (High Sensitivity): 50 ng/L — ABNORMAL HIGH (ref <18)

## 2019-01-26 MED ORDER — SODIUM CHLORIDE 0.9% FLUSH: 3.0000 mL | Freq: Once | INTRAVENOUS | Status: DC

## 2019-01-26 NOTE — ED Notes (Signed)
Called pt name x3 for room, no response from pt.  

## 2019-01-26 NOTE — ED Notes (Signed)
Called for repeat trop no answer 

## 2019-01-26 NOTE — ED Triage Notes (Signed)
Pt reports she her HR was over 200 8 hours ago, lasting an hour. EMS encouraged her to come in to ED to be seen, she declined transport, called her PCP this am and they advised her to come to ED.

## 2019-01-27 ENCOUNTER — Encounter (HOSPITAL_COMMUNITY): Payer: Self-pay

## 2019-01-27 ENCOUNTER — Emergency Department (HOSPITAL_COMMUNITY): Payer: 59

## 2019-01-27 ENCOUNTER — Other Ambulatory Visit: Payer: Self-pay

## 2019-01-27 ENCOUNTER — Other Ambulatory Visit: Payer: Self-pay | Admitting: Physician Assistant

## 2019-01-27 ENCOUNTER — Emergency Department (HOSPITAL_COMMUNITY)
Admission: EM | Admit: 2019-01-27 | Discharge: 2019-01-27 | Disposition: A | Discharge status: Home / Self Care | Payer: 59 | Attending: Emergency Medicine | Admitting: Emergency Medicine

## 2019-01-27 DIAGNOSIS — E0590 Thyrotoxicosis, unspecified without thyrotoxic crisis or storm: Secondary | ICD-10-CM

## 2019-01-27 DIAGNOSIS — Z79899 Other long term (current) drug therapy: Secondary | ICD-10-CM | POA: Diagnosis not present

## 2019-01-27 DIAGNOSIS — R000 Tachycardia, unspecified: Secondary | ICD-10-CM | POA: Diagnosis not present

## 2019-01-27 DIAGNOSIS — R079 Chest pain, unspecified: Secondary | ICD-10-CM | POA: Diagnosis present

## 2019-01-27 DIAGNOSIS — I471 Supraventricular tachycardia: Secondary | ICD-10-CM

## 2019-01-27 LAB — CBC
HCT: 35.2 % — ABNORMAL LOW (ref 36.0–46.0)
Hemoglobin: 10.9 g/dL — ABNORMAL LOW (ref 12.0–15.0)
MCH: 24.4 pg — ABNORMAL LOW (ref 26.0–34.0)
MCHC: 31 g/dL (ref 30.0–36.0)
MCV: 78.7 fL — ABNORMAL LOW (ref 80.0–100.0)
Platelets: 264 10*3/uL (ref 150–400)
RBC: 4.47 MIL/uL (ref 3.87–5.11)
RDW: 14 % (ref 11.5–15.5)
WBC: 7.5 10*3/uL (ref 4.0–10.5)
nRBC: 0 % (ref 0.0–0.2)

## 2019-01-27 LAB — BASIC METABOLIC PANEL
Anion gap: 11 (ref 5–15)
BUN: 11 mg/dL (ref 6–20)
CO2: 22 mmol/L (ref 22–32)
Calcium: 9.6 mg/dL (ref 8.9–10.3)
Chloride: 107 mmol/L (ref 98–111)
Creatinine, Ser: 0.59 mg/dL (ref 0.44–1.00)
GFR calc Af Amer: >60 mL/min (ref >60)
GFR calc non Af Amer: >60 mL/min (ref >60)
Glucose, Bld: 88 mg/dL (ref 70–99)
Potassium: 3.3 mmol/L — ABNORMAL LOW (ref 3.5–5.1)
Sodium: 140 mmol/L (ref 135–145)

## 2019-01-27 LAB — TROPONIN I (HIGH SENSITIVITY): Troponin I (High Sensitivity): 18 ng/L — ABNORMAL HIGH (ref <18)

## 2019-01-27 LAB — D-DIMER, QUANTITATIVE: D-Dimer, Quant: 0.75 ug/mL-FEU — ABNORMAL HIGH (ref 0.00–0.50)

## 2019-01-27 LAB — I-STAT BETA HCG BLOOD, ED (MC, WL, AP ONLY): I-stat hCG, quantitative: <5 m[IU]/mL (ref <5)

## 2019-01-27 LAB — T4, FREE: Free T4: 1.62 ng/dL — ABNORMAL HIGH (ref 0.61–1.12)

## 2019-01-27 LAB — TSH: TSH: <0.01 u[IU]/mL — ABNORMAL LOW (ref 0.350–4.500)

## 2019-01-27 MED ORDER — IOHEXOL 350 MG/ML SOLN
100.0000 mL | Freq: Once | INTRAVENOUS | Status: AC | PRN
  Administered 2019-01-27: 40 mL via INTRAVENOUS

## 2019-01-27 MED ORDER — SODIUM CHLORIDE 0.9% FLUSH: 3.0000 mL | Freq: Once | INTRAVENOUS | Status: DC

## 2019-01-27 NOTE — Progress Notes (Addendum)
   Dr. Harl Bowie requested to arrange 2 week outpatient event monitor and cardiology clinic new patient appt for SVT. Will send request to our office scheduler to call patient and arrange.  Dayna Dunn PA-C

## 2019-01-27 NOTE — ED Triage Notes (Signed)
Pt reports chest pain that started Thursday, pt was seen by Ems Thursday with a HR of 200. Pt came in last night and LWBS after triage due to wait time. Pt looked on MyChart and noted her troponin was 50 and in concerned she is having a heart attack. Pt anxious in triage. NAD noted

## 2019-01-27 NOTE — Discharge Instructions (Addendum)
Follow-up with your primary care doctor and endocrinology as discussed.  Cardiology will call you for an appointment as will to likely want you to wear heart monitor.  However, your symptoms today may be from mild hyperthyroidism.  Discuss medications with your primary care doctor/endocrinologist as discussed.

## 2019-01-27 NOTE — ED Provider Notes (Signed)
Pt signed out by Dr. Ronnald Nian pending CT chest.  IMPRESSION:  Negative examination for pulmonary embolism.    Pt's TSH is undetectable.  She has contacted her endocrinologist and has an appointment on Tuesday.  She is stable for d/c.  Return if worse.   Isla Pence, MD 01/27/19 1815

## 2019-01-27 NOTE — ED Provider Notes (Signed)
[facility] EMERGENCY DEPARTMENT Provider Note   CSN: [id number] Arrival date & time: 01/27/19  1230     History   Chief Complaint Chief Complaint  Patient presents with  . Chest Pain    HPI Sandra Murray is a 58 y.o. female.     The history is provided by the patient.  Chest Pain Pain location:  L chest Pain quality: tightness   Pain radiates to:  Does not radiate Pain severity:  No pain Onset quality:  Sudden Timing:  Rare Progression:  Resolved Chronicity:  New Context: at rest (Had possible SVT event two days ago. No active chest pain. Came to hospital and left without being seen but had troponin elevation. Hx of graves disease, no longer on meds. )   Relieved by:  Nothing Worsened by:  Nothing Associated symptoms: palpitations   Associated symptoms: no abdominal pain, no back pain, no cough, no fever, no shortness of breath and no vomiting   Risk factors: no coronary artery disease, no high cholesterol, no hypertension, no prior DVT/PE and no smoking     Past Medical History:  Diagnosis Date  . Graves disease     Patient Active Problem List   Diagnosis Date Noted  . Graves disease 02/24/2018  . Vitamin D deficiency 02/24/2018    History reviewed. No pertinent surgical history.   OB History   No obstetric history on file.      Home Medications    Prior to Admission medications   Medication Sig Start Date End Date Taking? Authorizing Provider  Ascorbic Acid (VITAMIN C) 100 MG tablet Take 1 tablet by mouth daily.   Yes [provider]  Cholecalciferol (VITAMIN D3) 25 MCG (1000 UT) CAPS Take by mouth. Take 1 tablet by mouth three times weekly.   Yes [provider]  riboflavin (VITAMIN B-2) 100 MG TABS tablet Take 1 tablet by mouth daily.   Yes [provider]  Selenium 200 MCG CAPS Take 1 tablet by mouth 2 (two) times daily.   Yes [provider]    Family History Family History   Problem Relation Age of Onset  . Stroke Mother   . Heart disease Mother   . Alzheimer's disease Father     Social History Social History   Tobacco Use  . Smoking status: Never Smoker  . Smokeless tobacco: Never Used  Substance Use Topics  . Alcohol use: Never    Frequency: Never  . Drug use: Never     Allergies   Patient has no known allergies.   Review of Systems Review of Systems  Constitutional: Negative for chills and fever.  HENT: Negative for ear pain and sore throat.   Eyes: Negative for pain and visual disturbance.  Respiratory: Negative for cough and shortness of breath.   Cardiovascular: Positive for chest pain and palpitations.  Gastrointestinal: Negative for abdominal pain and vomiting.  Genitourinary: Negative for dysuria and hematuria.  Musculoskeletal: Negative for arthralgias and back pain.  Skin: Negative for color change and rash.  Neurological: Negative for seizures and syncope.  All other systems reviewed and are negative.    Physical Exam Updated Vital Signs BP (!) 148/62 (BP Location: Right Arm)   Pulse 88   Temp 98.4 F (36.9 C) (Oral)   Resp 14   SpO2 100%   Physical Exam Vitals signs and nursing note reviewed.  Constitutional:      General: She is not in acute distress.  Appearance: She is well-developed.  HENT:     Head: Normocephalic and atraumatic.  Eyes:     Extraocular Movements: Extraocular movements intact.     Conjunctiva/sclera: Conjunctivae normal.     Pupils: Pupils are equal, round, and reactive to light.  Neck:     Musculoskeletal: Normal range of motion and neck supple.  Cardiovascular:     Rate and Rhythm: Regular rhythm. Tachycardia present.     Pulses:          Radial pulses are 2+ on the right side and 2+ on the left side.     Heart sounds: Normal heart sounds. No murmur.  Pulmonary:     Effort: Pulmonary effort is normal. No respiratory distress.     Breath sounds: Normal breath sounds. No decreased  breath sounds, wheezing, rhonchi or rales.  Abdominal:     Palpations: Abdomen is soft.     Tenderness: There is no abdominal tenderness.  Musculoskeletal: Normal range of motion.  Skin:    General: Skin is warm and dry.     Capillary Refill: Capillary refill takes less than 2 seconds.  Neurological:     General: No focal deficit present.     Mental Status: She is alert and oriented to person, place, and time.     Cranial Nerves: No cranial nerve deficit.     Motor: No weakness.  Psychiatric:        Mood and Affect: Mood normal.      ED Treatments / Results  Labs (all labs ordered are listed, but only abnormal results are displayed) Labs Reviewed  BASIC METABOLIC PANEL - Abnormal; Notable for the following components:      Result Value   Potassium 3.3 (*)    All other components within normal limits  CBC - Abnormal; Notable for the following components:   Hemoglobin 10.9 (*)    HCT 35.2 (*)    MCV 78.7 (*)    MCH 24.4 (*)    All other components within normal limits  D-DIMER, QUANTITATIVE (NOT AT [facility]) - Abnormal; Notable for the following components:   D-Dimer, Quant 0.75 (*)    All other components within normal limits  TSH - Abnormal; Notable for the following components:   TSH <0.010 (*)    All other components within normal limits  T4, FREE - Abnormal; Notable for the following components:   Free T4 1.62 (*)    All other components within normal limits  TROPONIN I (HIGH SENSITIVITY) - Abnormal; Notable for the following components:   Troponin I (High Sensitivity) 18 (*)    All other components within normal limits  T3, FREE  I-STAT BETA HCG BLOOD, ED ([facility], [facility], [facility] ONLY)    EKG EKG Interpretation  Date/Time:  Saturday January 27 2019 12:39:26 EST Ventricular Rate:  101 PR Interval:  134 QRS Duration: 74 QT Interval:  312 QTC Calculation: 404 R Axis:   63 Text Interpretation: Sinus tachycardia Otherwise normal ECG Confirmed by Virgina NorfolkAdam, [place] [phone]([id number]) on  01/27/2019 2:00:18 PM   Radiology Dg Chest 2 View  Result Date: 01/26/2019 CLINICAL DATA:  Chest pain and cardiac palpitations EXAM: CHEST - 2 VIEW COMPARISON:  None. FINDINGS: Lungs are clear. Heart size and pulmonary vascularity are normal. No adenopathy. No pneumothorax. No bone lesions. IMPRESSION: No edema or consolidation. Electronically Signed   By: Bretta BangWilliam  Woodruff III M.D.   On: 01/26/2019 12:48   Ct Angio Chest Pe W And/or Wo Contrast  Result Date: 01/27/2019  CLINICAL DATA:  Chest pain EXAM: CT ANGIOGRAPHY CHEST WITH CONTRAST TECHNIQUE: Multidetector CT imaging of the chest was performed using the standard protocol during bolus administration of intravenous contrast. Multiplanar CT image reconstructions and MIPs were obtained to evaluate the vascular anatomy. CONTRAST:  3mL OMNIPAQUE IOHEXOL 350 MG/ML SOLN COMPARISON:  None. FINDINGS: Cardiovascular: Satisfactory opacification of the pulmonary arteries to the segmental level. No evidence of pulmonary embolism. Normal heart size. No pericardial effusion. 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 Mediastinum/Nodes: No enlarged mediastinal, hilar, or axillary lymph nodes. Calcified right hilar and pretracheal lymph nodes, consistent with prior granulomatous infection. Thyroid gland, trachea, and esophagus demonstrate no significant findings. Lungs/Pleura: Lungs are clear. No pleural effusion or pneumothorax. Upper Abdomen: No acute abnormality. Musculoskeletal: No chest wall abnormality. No acute or significant osseous findings. Review of the MIP images confirms the above findings. IMPRESSION: Negative examination for pulmonary embolism. Electronically Signed   By: Lauralyn Primes M.D.   On: 01/27/2019 18:01    Procedures Procedures (including critical care time)  Medications Ordered in ED Medications  sodium chloride flush (NS) 0.9 % injection 3 mL (has no administration in time range)  iohexol  (OMNIPAQUE) 350 MG/ML injection 100 mL (40 mLs Intravenous Contrast Given 01/27/19 1734)     Initial Impression / Assessment and Plan / ED Course  I have reviewed the triage vital signs and the nursing notes.  Pertinent labs & imaging results that were available during my care of the patient were reviewed by me and considered in my medical decision making (see chart for details).        Sandra Murray is a 58 year old female with history of Graves' disease who presents to the ED for chest pain/palpitations.  Patient with unremarkable vitals except for some mild tachycardia.  Had an episode of palpitations 2 nights ago that she thinks was SVT per EMS providers.  She refused transport.  She states that it lasted about an hour and then appeared to self resolve.  Patient has had Graves' disease and has been on methimazole and beta-blocker in the past but has recently stopped.  Her last thyroid studies were unremarkable she says.  Patient came to the emergency department yesterday for an evaluation after she was directed by her primary care doctor.  She had lab work drawn and then left without being seen.  She comes back today because her troponin was elevated to 50.  She has not had any cardiac type chest pain during this time.  She has not had shortness of breath.  However she is mildly tachycardic.  Possible coronavirus infection back in August but she states that it was a false positive.  She does not have any infectious symptoms.  We will get basic labs including EKG.  Troponin has already been drawn.  EKG shows sinus tachycardia, no ischemic changes.  Repeat troponin is 18 down from 50 from 2 days ago.  Talked with Dr. Wyline Mood with cardiology who states that patient can follow-up outpatient for heart monitor.  Would recommend started beta-blocker.  Patient is hesitant to start beta-blocker at this time as she states that she has follow-up with her primary care doctor/endo in 2 days and they can  make shared decision.  D-dimer is elevated and will obtain CT scan to rule out PE.  Patient had no other significant anemia, electrolyte abnormality, kidney injury.  However, TSH is undetectable, free T4 slightly elevated at 1.62.  Previously was 0.4.  Likely  tachycardia and intermittent palpitations could be from ongoing hyperthyroidism.  We will have her follow-up closely with her endocrinologist about treatment options.  She does not appear to be in thyroid storm.  Patient was handed off to oncoming ED staff with patient pending CT scan to rule out PE.  Anticipate follow-up with primary care doctor and endocrinology and cardiology. Pt understands plan and appreciates plan.  This chart was dictated using voice recognition software.  Despite best efforts to proofread,  errors can occur which can change the documentation meaning.     Final Clinical Impressions(s) / ED Diagnoses   Final diagnoses:  Tachycardia  Hyperthyroidism    ED Discharge Orders    None       Lennice Sites, DO 01/27/19 1842

## 2019-01-27 NOTE — ED Notes (Signed)
Patient verbalizes understanding of discharge instructions . Opportunity for questions and answers were provided . Armband removed by staff ,Pt discharged from ED. W/C  offered at D/C  and Declined W/C at D/C and was escorted to lobby by RN.  

## 2019-01-27 NOTE — Progress Notes (Signed)
Patient discussed with ER staff. Episode of palpitations with reportedly heart rates to 200s at home, no strips available. Initial trop 50, on repeat check down to 18. EKGs in ER SR no ischemic changes. No significant arrhythmias on telemetry. Suspect SVT with related trop elevation that is resolving. Agree with a Ddimer, if positive then CT PE. From cardiac standpoint would start lopressor 25mg  bid, we will arrange an outpatient event monitor and cardiology clinic appt. Do not plan for any further inpatient cardiac evaluation.   Carlyle Dolly MD

## 2019-01-29 ENCOUNTER — Telehealth: Payer: Self-pay

## 2019-01-29 ENCOUNTER — Other Ambulatory Visit: Payer: Self-pay | Admitting: Internal Medicine

## 2019-01-29 ENCOUNTER — Encounter: Payer: Self-pay | Admitting: Internal Medicine

## 2019-01-29 DIAGNOSIS — E0500 Thyrotoxicosis with diffuse goiter without thyrotoxic crisis or storm: Secondary | ICD-10-CM

## 2019-01-29 MED ORDER — ATENOLOL 25 MG PO TABS: 25.0000 mg | ORAL_TABLET | Freq: Every day | ORAL | 3 refills | Status: DC

## 2019-01-29 MED ORDER — METHIMAZOLE 5 MG PO TABS: 5.0000 mg | ORAL_TABLET | Freq: Two times a day (BID) | ORAL | 3 refills | Status: DC

## 2019-01-29 NOTE — Telephone Encounter (Signed)
Patient called in wanting to know if she needed to still get labs done. This relates  to prior messages on my chart between PT and MD. Patient also would like to ask doctor does she want to start her back on medications : Tapazole, and Tenormin    Please call and advise

## 2019-02-13 ENCOUNTER — Telehealth: Payer: Self-pay | Admitting: *Deleted

## 2019-02-13 NOTE — Telephone Encounter (Signed)
14 day ZIO AT to be mailed to the patient home pending patient approval to [facility].  Instructions reviewed briefly as they are included in the monitor kit.  Dr. Harl Bowie to read.  Results to patients PCP Dr. Crissie Sickles.

## 2019-02-23 ENCOUNTER — Other Ambulatory Visit: Payer: Self-pay

## 2019-02-27 ENCOUNTER — Ambulatory Visit (INDEPENDENT_AMBULATORY_CARE_PROVIDER_SITE_OTHER): Payer: 59 | Admitting: Internal Medicine

## 2019-02-27 ENCOUNTER — Encounter: Payer: Self-pay | Admitting: Internal Medicine

## 2019-02-27 VITALS — BP 110/60 | HR 82 | Ht 64.0 in | Wt 142.0 lb

## 2019-02-27 DIAGNOSIS — E0500 Thyrotoxicosis with diffuse goiter without thyrotoxic crisis or storm: Secondary | ICD-10-CM | POA: Diagnosis not present

## 2019-02-27 DIAGNOSIS — E559 Vitamin D deficiency, unspecified: Secondary | ICD-10-CM | POA: Diagnosis not present

## 2019-02-27 LAB — T4, FREE: Free T4: 0.4 ng/dL — ABNORMAL LOW (ref 0.60–1.60)

## 2019-02-27 LAB — VITAMIN D 25 HYDROXY (VIT D DEFICIENCY, FRACTURES): VITD: 36.48 ng/mL (ref 30.00–100.00)

## 2019-02-27 LAB — TSH: TSH: 2.65 u[IU]/mL (ref 0.35–4.50)

## 2019-02-27 LAB — T3, FREE: T3, Free: 3.3 pg/mL (ref 2.3–4.2)

## 2019-02-27 MED ORDER — ATENOLOL 25 MG PO TABS: 25.0000 mg | ORAL_TABLET | Freq: Every day | ORAL | 3 refills | Status: DC

## 2019-02-27 NOTE — Progress Notes (Signed)
Patient ID: Sandra Murray, female   DOB: 30-Mar-1960, 58 y.o.   MRN: [id number]   This visit occurred during the SARS-CoV-2 public health emergency.  Safety protocols were in place, including screening questions prior to the visit, additional usage of staff PPE, and extensive cleaning of exam room while observing appropriate contact time as indicated for disinfecting solutions.   HPI  Sandra Murray is a 58 y.o.-year-old female, initially referred by her gastroenterologist, Dr. Loreta Ave, returning for follow-up for Graves' disease.  Last visit 4.5 months ago.  Reviewed and addended history: She started to have some  weight loss and bone pain in 2018 >> presented to see PCP  - TSH was undetectable. She was sent to a rheumatologist for suspicion for RA >> testing was negative.  No intervention was suggested for her abnormal thyroid tests then.  In 10/2017, she started to have: - palpitations - whooshing sound in year - lost weight (also very stressed as she is taking care of her father who has dementia) - tremors - anxiety - insomnia  She presented to see her current PCP (Dr. Leavy Cella) >> TSH was again undetectable.  Again, no intervention was suggested.  She then saw Dr. Loreta Ave for a screening colonoscopy >> Dr. Loreta Ave referred her to me urgently for her thyrotoxicosis.  We initially started: -Methimazole 10 mg 2x a day -Atenolol 25 mg daily She did not return for repeat TFTs 1 month after starting the medication (miscommunication?).  However, when she returned to the clinic for another she was telling me that she felt much better.  She gained weight, did not have palpitations or tremors.  At that time, her TSH was very high, at 46.86.  We decrease the methimazole to 5 mg daily and ended up stopping it completely in 06/2018.  At that time, we also started to taper down atenolol and then stop completely.  Subsequent TFTs were normal in 07/2018, however, she called [place] that she started to have  hyperthyroid symptoms again and we restarted a low-dose methimazole, 5 mg daily.  However, in 09/2018, her TSH was again high, at 15.86 I will stop methimazole.  She did not return for repeat labs, and presented to the emergency room in 01/2019 with tachycardia, palpitations, and the TSH was found to be undetectable.  We restarted methimazole, currently at 5 mg twice a day.  She called [place] in 08/2018 for diplopia and I directed her to her ophthalmologist.  She actually saw a optometrist who noticed an abnormality in her gaze and told her that this is possibly related to the thyroid condition, but did not suggest any other follow-up or treatment.  At last visit, I referred her to Dr. Toni Arthurs and she did establish care with her.  She initialy had diplopia especially when looking up, now much better, on Diclofenac.  Reviewed her TFTs: Lab Results  Component Value Date   TSH <0.010 (L) 01/27/2019   TSH 15.86 (H) 10/09/2018   TSH 0.47 07/21/2018   TSH 45.41 (H) 06/27/2018   TSH 46.86 (H) 06/09/2018   TSH <0.01 Repeated and verified X2. (L) 02/24/2018   FREET4 1.62 (H) 01/27/2019   FREET4 0.40 (L) 10/09/2018   FREET4 0.75 07/21/2018   FREET4 <0.25 (L) 06/27/2018   FREET4 <0.25 (L) 06/09/2018   FREET4 3.34 (H) 02/24/2018   T3FREE 2.9 10/09/2018   T3FREE 4.7 (H) 07/21/2018   T3FREE 2.4 06/27/2018   T3FREE 2.0 (L) 06/09/2018   T3FREE 17.0 (H) 02/24/2018  02/08/2018:  TSH <0.010 12/15/2016: TSH <0.010 12/18/2015: TSH 2.192  Her TSI's were elevated, but improving at last check: Lab Results  Component Value Date   TSI 389 (H) 10/09/2018   TSI 504 (H) 02/24/2018   Pt denies: - feeling nodules in neck - hoarseness - dysphagia - choking - SOB with lying down  Pt does not have a FH of thyroid ds. No FH of thyroid cancer. No h/o radiation tx to head or neck.  No herbal supplements. No Biotin use. No recent steroids use.   Of note, she also has a history of vitamin deficiency with a level of 14  on 02/08/2018 and a level of 12 on 12/18/2015.  Review latest vitamin D level: Lab Results  Component Value Date   VD25OH 28.27 (L) 10/09/2018     She is on vitamin D supplement (she is not sure whether she is the recommended dose of 4000 units daily and she will look at home)  She also has a history of anemia.  ROS: Constitutional: + weight gain/no weight loss, no fatigue, no subjective hyperthermia, no subjective hypothermia Eyes: no blurry vision, no xerophthalmia ENT: no sore throat, no nodules palpated in neck, no dysphagia, no odynophagia, no hoarseness Cardiovascular: no CP/no SOB/no palpitations/no leg swelling Respiratory: no cough/no SOB/no wheezing Gastrointestinal: no N/no V/no D/no C/no acid reflux Musculoskeletal: no muscle aches/no joint aches Skin: no rashes, no hair loss Neurological: no tremors/no numbness/no tingling/no dizziness  I reviewed pt's medications, allergies, PMH, social hx, family hx, and changes were documented in the history of present illness. Otherwise, unchanged from my initial visit note. PMH: -See above  No past surgical history  Social History   Socioeconomic History  . Marital status: Married    Spouse name: Not on file  . Number of children: 0  . Years of education: Not on file  . 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 Smoking status: Never Smoker  . Smokeless tobacco: Never Used  Substance and Sexual Activity  . Alcohol use: No  . Drug use: No   Current Outpatient Medications  Medication Sig Dispense Refill  . Ascorbic Acid (VITAMIN C) 100 MG tablet Take 1 tablet by mouth daily.    Marland Kitchen atenolol (TENORMIN) 25 MG tablet Take 1 tablet (25 mg total) by mouth daily. 60 tablet 3  . Cholecalciferol (VITAMIN D3) 25 MCG (1000 UT) CAPS Take by mouth. Take 1 tablet by mouth three times weekly.    . methimazole (TAPAZOLE) 5 MG tablet Take 1 tablet (5 mg total) by mouth 2  (two) times daily. 90 tablet 3  . riboflavin (VITAMIN B-2) 100 MG TABS tablet Take 1 tablet by mouth daily.    . Selenium 200 MCG CAPS Take 1 tablet by mouth 2 (two) times daily.     No current facility-administered medications for this visit.     No Known Allergies   Family History  Problem Relation Age of Onset  . Stroke Mother   . Heart disease Mother   . Alzheimer's disease Father     Mother  - heart ds Father  - Prostate cancer  PE: BP 110/60   Pulse 82   Ht 5\' 4"  (1.626 m)   Wt 142 lb (64.4 kg)   SpO2 98%   BMI 24.37 kg/m  Wt Readings from Last 3 Encounters:  02/27/19 142 lb (64.4 kg)  10/09/18 135 lb 6.4 oz (61.4 kg)  06/09/18 133 lb (60.3 kg)   Constitutional: overweight, in NAD Eyes: PERRLA, EOMI, + exophthalmos, + stare, no lid lag ENT: moist mucous membranes, no thyromegaly, no cervical lymphadenopathy Cardiovascular: RRR, No MRG Respiratory: CTA B Gastrointestinal: abdomen soft, NT, ND, BS+ Musculoskeletal: no deformities, strength intact in all 4 Skin: moist, warm, no rashes Neurological: no tremor with outstretched hands, DTR normal in all 4  ASSESSMENT: 1. Graves ds.  2.  Diplopia  3. Vit D deficiency  PLAN:  1. Patient with history of low TSH, confirmed as Graves' disease based on high TSI antibodies, with initially thyrotoxic symptoms: Weight loss, palpitations, tremors, anxiety, which resolved after starting methimazole.  She had thyrotoxic TFTs for at least a year and 3 months before first visit, with first occurrence in 11/2017.  We discussed in the past about possible consequences of uncontrolled thyrotoxicosis to include arrhythmia, heart failure, hypercoagulability with increased risk of stroke, PE and MI, sudden death.  She finally agreed to start methimazole and atenolol.  However, she did not return for repeat labs and the next TSH returned very high, at 47.  At that time, we decrease the methimazole dose to 5 mg daily and we ended up  stopping the methimazole completely 06/2018.  Her TFTs normalized 07/2018, however, she had return of thyrotoxicosis symptoms and we restarted low-dose methimazole, 5 mg daily.  In 09/2018, her TSH was again high, at 15.86 so we again stop methimazole.  However, she presented to the emergency room with tachycardia and palpitations at the beginning of 01/2019.  We restarted methimazole at that time, currently on 5 mg twice a day. -She was able to come off atenolol in the past, and will restart today at night as her HR increases at night -We again discussed about possible modalities for treatment for Graves' disease to include continuing methimazole, RAI treatment, or, last resort, thyroidectomy.  Due to the variability in her TFTs on methimazole, at this point, I suggested RAI treatment.  However, she would like to avoid this.  She would like to continue with methimazole but we discussed that it is paramount to return for labs approximately 5 weeks after the dose changes in methimazole to be able to get her thyroid condition under control.  She agrees to do so. -At today's visit, we will recheck her TFTs -I will see her back in 3-4 months  2.  Diplopia -With upward gaze >> improved -She also has exophthalmos, but no chemosis, eye pain -TSI's were still high at last visit, but improving -I referred her to Dr. Ralene Muskrat at [facility] >> she established care with her >> started Diclofenac >> helping. Solumedrol and Alveda Reasons considered next, but not needed now.  3. Vit D def -She has a history of vitamin D deficiency.  We checked this at last visit and the vitamin D was still low, so I advised her to increase her vitamin D supplement dose to 4000 units daily. She will look at home to see if this is the dose she is taking (she is unsure). -We will recheck the level today  Orders Placed This Encounter  Procedures  . xtpit - TSH  . xtpit - free T4  . xtpit - free T3  . VITAMIN D 25 Hydroxy (Vit-D  Deficiency, Fractures)   Component     Latest Ref Rng & Units 02/27/2019  TSH     0.35 - 4.50 uIU/mL 2.65  T4,Free(Direct)     0.60 - 1.60 ng/dL 4.03 (L)  Triiodothyronine,Free,Serum     2.3 - 4.2 pg/mL 3.3  VITD     30.00 - 100.00 ng/mL 36.48   Normal vitamin D.  She is actually taking 2000 units daily, which I will have her continue. The thyroid tests have improved and the free T4 is now low.  We will back off the methimazole to 5 mg daily and repeat her test in 4-5 weeks.  Carlus Pavlov, MD PhD [facility] Endocr[facility]

## 2019-02-27 NOTE — Patient Instructions (Signed)
Please continue methimazole 5 mg twice a day for now.  Continue vitamin D 4000 units daily.  Please stop at the lab.  Please come back for a follow-up appointment in 3 months.

## 2019-02-28 MED ORDER — METHIMAZOLE 5 MG PO TABS: 5.0000 mg | ORAL_TABLET | Freq: Every day | ORAL | 3 refills | Status: DC

## 2019-03-09 DIAGNOSIS — Z20828 Contact with and (suspected) exposure to other viral communicable diseases: Secondary | ICD-10-CM | POA: Diagnosis not present

## 2019-04-02 ENCOUNTER — Other Ambulatory Visit: Payer: 59

## 2019-04-12 ENCOUNTER — Ambulatory Visit: Payer: 59 | Admitting: Internal Medicine

## 2019-04-20 ENCOUNTER — Other Ambulatory Visit: Payer: Self-pay

## 2019-04-20 ENCOUNTER — Other Ambulatory Visit: Payer: Self-pay | Admitting: Internal Medicine

## 2019-04-20 ENCOUNTER — Other Ambulatory Visit (INDEPENDENT_AMBULATORY_CARE_PROVIDER_SITE_OTHER): Payer: BC Managed Care – PPO

## 2019-04-20 DIAGNOSIS — E0500 Thyrotoxicosis with diffuse goiter without thyrotoxic crisis or storm: Secondary | ICD-10-CM

## 2019-04-20 LAB — T4, FREE: Free T4: 0.48 ng/dL — ABNORMAL LOW (ref 0.60–1.60)

## 2019-04-20 LAB — TSH: TSH: 7.54 u[IU]/mL — ABNORMAL HIGH (ref 0.35–4.50)

## 2019-04-20 LAB — T3, FREE: T3, Free: 2.9 pg/mL (ref 2.3–4.2)

## 2019-05-21 DIAGNOSIS — H2513 Age-related nuclear cataract, bilateral: Secondary | ICD-10-CM | POA: Diagnosis not present

## 2019-05-21 DIAGNOSIS — E063 Autoimmune thyroiditis: Secondary | ICD-10-CM | POA: Diagnosis not present

## 2019-05-21 DIAGNOSIS — H04123 Dry eye syndrome of bilateral lacrimal glands: Secondary | ICD-10-CM | POA: Diagnosis not present

## 2019-05-21 DIAGNOSIS — H532 Diplopia: Secondary | ICD-10-CM | POA: Diagnosis not present

## 2019-06-21 NOTE — Telephone Encounter (Signed)
[facility] has not received monitor back from patient. They have attempted to contact the patient on 3 occasions to request monitor be returned to [facility].  CANCELLED ORDER 

## 2019-07-04 ENCOUNTER — Ambulatory Visit: Payer: BC Managed Care – PPO | Admitting: Internal Medicine

## 2019-07-23 DIAGNOSIS — E063 Autoimmune thyroiditis: Secondary | ICD-10-CM | POA: Diagnosis not present

## 2019-07-23 DIAGNOSIS — H04123 Dry eye syndrome of bilateral lacrimal glands: Secondary | ICD-10-CM | POA: Diagnosis not present

## 2019-07-23 DIAGNOSIS — H2513 Age-related nuclear cataract, bilateral: Secondary | ICD-10-CM | POA: Diagnosis not present

## 2019-08-02 DIAGNOSIS — Z01419 Encounter for gynecological examination (general) (routine) without abnormal findings: Secondary | ICD-10-CM | POA: Diagnosis not present

## 2019-08-02 DIAGNOSIS — Z124 Encounter for screening for malignant neoplasm of cervix: Secondary | ICD-10-CM | POA: Diagnosis not present

## 2019-08-13 ENCOUNTER — Other Ambulatory Visit: Payer: Self-pay

## 2019-08-15 ENCOUNTER — Ambulatory Visit (INDEPENDENT_AMBULATORY_CARE_PROVIDER_SITE_OTHER): Payer: BC Managed Care – PPO | Admitting: Internal Medicine

## 2019-08-15 ENCOUNTER — Other Ambulatory Visit: Payer: Self-pay

## 2019-08-15 ENCOUNTER — Encounter: Payer: Self-pay | Admitting: Internal Medicine

## 2019-08-15 VITALS — BP 120/76 | HR 90 | Ht 64.0 in | Wt 134.0 lb

## 2019-08-15 DIAGNOSIS — E559 Vitamin D deficiency, unspecified: Secondary | ICD-10-CM

## 2019-08-15 DIAGNOSIS — H532 Diplopia: Secondary | ICD-10-CM

## 2019-08-15 DIAGNOSIS — E0500 Thyrotoxicosis with diffuse goiter without thyrotoxic crisis or storm: Secondary | ICD-10-CM | POA: Diagnosis not present

## 2019-08-15 LAB — T3, FREE: T3, Free: 4.6 pg/mL — ABNORMAL HIGH (ref 2.3–4.2)

## 2019-08-15 LAB — T4, FREE: Free T4: 1.32 ng/dL (ref 0.60–1.60)

## 2019-08-15 LAB — TSH: TSH: <0.01 u[IU]/mL — ABNORMAL LOW (ref 0.35–4.50)

## 2019-08-15 MED ORDER — ATENOLOL 25 MG PO TABS: 25.0000 mg | ORAL_TABLET | Freq: Every day | ORAL | 3 refills | Status: DC

## 2019-08-15 NOTE — Progress Notes (Signed)
Patient ID: Sandra Murray, female   DOB: Jun 26, 1960, 59 y.o.   MRN: [id number]   This visit occurred during the SARS-CoV-2 public health emergency.  Safety protocols were in place, including screening questions prior to the visit, additional usage of staff PPE, and extensive cleaning of exam room while observing appropriate contact time as indicated for disinfecting solutions.   HPI  Sandra Murray is a 59 y.o.-year-old female, initially referred by her gastroenterologist, Dr. Loreta Ave, returning for follow-up for Graves' disease.  Last visit 6 months ago.  Reviewed and addended history: She started to have some  weight loss and bone pain in 2018 >> presented to see PCP  - TSH was undetectable. She was sent to a rheumatologist for suspicion for RA >> testing was negative.  No intervention was suggested for her abnormal thyroid tests then.  In 10/2017, she started to have: - palpitations - whooshing sound in year - lost weight (also very stressed as she is taking care of her father who has dementia) - tremors - anxiety - insomnia  She presented to see her current PCP (Dr. Leavy Cella) >> TSH was again undetectable.  Again, no intervention was suggested.  She then saw Dr. Loreta Ave for a screening colonoscopy >> Dr. Loreta Ave referred her to me urgently for her thyrotoxicosis.  We initially started: -Methimazole 10 mg 2x a day -Atenolol 25 mg daily She did not return for repeat TFTs 1 month after starting the medication (miscommunication?).  However, when she returned to the clinic for another she was telling me that she felt much better.  She gained weight, did not have palpitations or tremors.  At that time, her TSH was very high, at 46.86.  We decrease the methimazole to 5 mg daily and ended up stopping it completely in 06/2018.  At that time, we also started to taper down atenolol and then stop completely.  Subsequent TFTs were normal in 07/2018, however, she called [place] that she started to have  hyperthyroid symptoms again and we restarted a low-dose methimazole, 5 mg daily.  However, in 09/2018, her TSH was again high, at 15.86 I will stop methimazole.  She did not return for repeat labs, and presented to the emergency room in 01/2019 with tachycardia, palpitations, and the TSH was found to be undetectable.  We restarted methimazole, at 5 mg twice a day.  She called [place] in 08/2018 for diplopia and I directed her to her ophthalmologist.  She actually saw a optometrist who noticed an abnormality in her gaze and told her that this is possibly related to the thyroid condition, but did not suggest any other follow-up or treatment.  At last visit, I referred her to Dr. Toni Arthurs and she did establish care with her.  She initialy had diplopia especially when looking up, then much better on diclofenac - now stopped.  We reduced the MMI dose to 5 mg daily in 02/2019.  Since last visit, she had a slightly high TSH in 03/2019 and I advised her to stop the methimazole to 5 mg once a day.  Unfortunately, she did not return for labs afterwards (Father passed away, also legal problems with her family).  Reviewed her TFTs: Lab Results  Component Value Date   TSH 7.54 (H) 04/20/2019   TSH 2.65 02/27/2019   TSH <0.010 (L) 01/27/2019   TSH 15.86 (H) 10/09/2018   TSH 0.47 07/21/2018   TSH 45.41 (H) 06/27/2018   TSH 46.86 (H) 06/09/2018   TSH <0.01 Repeated and  verified X2. (L) 02/24/2018   FREET4 0.48 (L) 04/20/2019   FREET4 0.40 (L) 02/27/2019   FREET4 1.62 (H) 01/27/2019   FREET4 0.40 (L) 10/09/2018   FREET4 0.75 07/21/2018   FREET4 <0.25 (L) 06/27/2018   FREET4 <0.25 (L) 06/09/2018   FREET4 3.34 (H) 02/24/2018   T3FREE 2.9 04/20/2019   T3FREE 3.3 02/27/2019   T3FREE 2.9 10/09/2018   T3FREE 4.7 (H) 07/21/2018   T3FREE 2.4 06/27/2018   T3FREE 2.0 (L) 06/09/2018   T3FREE 17.0 (H) 02/24/2018  02/08/2018: TSH <0.010 12/15/2016: TSH <0.010 12/18/2015: TSH 2.192  Her TSI's were still elevated at  last check: Lab Results  Component Value Date   TSI 389 (H) 10/09/2018   TSI 504 (H) 02/24/2018   Pt denies: - feeling nodules in neck - hoarseness - dysphagia - choking - SOB with lying down  No FH of thyroid disease or thyroid cancer. No h/o radiation tx to head or neck.  No seaweed or kelp. No recent contrast studies. No herbal supplements. No Biotin use. No recent steroids use.   Vitamin deficiency   Reviewed vitamin D levels: Lab Results  Component Value Date   VD25OH 36.48 02/27/2019   VD25OH 28.27 (L) 10/09/2018  - level of 14 on 02/08/2018  - level of 12 on 12/18/2015  At last visit she was only on 2000 units vitamin D daily (recommended 4000 units vitamin D daily in the past).  However, vitamin D was normal so we continued this dose - but missed last few weeks.  She also has a history of anemia.  ROS: Constitutional: no weight gain/+ intentional weight loss, no fatigue, no subjective hyperthermia, no subjective hypothermia Eyes: no blurry vision, no xerophthalmia ENT: no sore throat,  + see HPI Cardiovascular: no CP/no SOB/+ palpitations/no leg swelling Respiratory: no cough/no SOB/no wheezing Gastrointestinal: no N/no V/no D/no C/no acid reflux Musculoskeletal: no muscle aches/no joint aches Skin: no rashes, no hair loss Neurological: + tremors/no numbness/no tingling/no dizziness  I reviewed pt's medications, allergies, PMH, social hx, family hx, and changes were documented in the history of present illness. Otherwise, unchanged from my initial visit note. PMH: -See above  No past surgical history  Social History   Socioeconomic History  . Marital status: Married    Spouse name: Not on file  . Number of children: 0  . Years of education: Not on file  . 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 Smoking status: Never Smoker  . Smokeless tobacco: Never Used  Substance and Sexual  Activity  . Alcohol use: No  . Drug use: No   Current Outpatient Medications  Medication Sig Dispense Refill  . Ascorbic Acid (VITAMIN C) 100 MG tablet Take 1 tablet by mouth daily.    Marland Kitchen atenolol (TENORMIN) 25 MG tablet Take 1 tablet (25 mg total) by mouth daily. 60 tablet 3  . Cholecalciferol (VITAMIN D3) 25 MCG (1000 UT) CAPS Take by mouth. Take 1 tablet by mouth three times weekly.    . riboflavin (VITAMIN B-2) 100 MG TABS tablet Take 1 tablet by mouth daily.    . Selenium 200 MCG CAPS Take 1 tablet by mouth 2 (two) times daily.     No current facility-administered medications for this visit.    No Known Allergies   Family History  Problem Relation Age of Onset  . Stroke Mother   . Heart disease Mother   . Alzheimer's  disease Father     Mother  - heart ds Father  - Prostate cancer  PE: BP 120/76   Pulse 90   Ht 5\' 4"  (1.626 m)   Wt 134 lb (60.8 kg)   SpO2 98%   BMI 23.00 kg/m  Wt Readings from Last 3 Encounters:  08/15/19 134 lb (60.8 kg)  02/27/19 142 lb (64.4 kg)  10/09/18 135 lb 6.4 oz (61.4 kg)   Constitutional: normal weight, in NAD Eyes: PERRLA, EOMI, + exophthalmos, + stare ENT: moist mucous membranes, no thyromegaly, no cervical lymphadenopathy Cardiovascular: RRR, No RG, + SEM 1/6 Respiratory: CTA B Gastrointestinal: abdomen soft, NT, ND, BS+ Musculoskeletal: no deformities, strength intact in all 4 Skin: moist, warm, no rashes Neurological: no tremor with outstretched hands, DTR normal in all 4  ASSESSMENT: 1. Graves ds.  2.  Diplopia  3. Vit D deficiency  PLAN:  1. Patient with history of low TSH, confirmed as Graves' disease based on high TSI antibodies.  She initially had thyrotoxic symptoms: Weight loss, palpitations, tremors, anxiety, which resolved after starting methimazole.  We discussed in the past about possible consequences of uncontrolled thyrotoxicosis to include arrhythmia, heart failure, hypercoagulability with increased risk of  stroke, PE and MI, sudden death.  She finally agreed to stop methimazole and atenolol, however, she did not return for labs in the next TSH returned very high at 47.  At that time, we decreased her methimazole dose to 5 mg daily and ended up stopping the methimazole completely in 06/2018.  Her TFTs normalized in 07/2018, however, she had return of thyrotoxicosis symptoms and we had to restart low-dose methimazole 5 mg daily.   In 09/2018, her TSH was again high, at 15.86 so we again stop methimazole.  However, she presented to the emergency room with tachycardia and palpitations at the beginning of 01/2019.  We restarted methimazole at that time, and she was on 5 mg twice daily at last visit -She was able to come off of atenolol in the past, but restarted at last visit that she was having high heart rates at night -At last visit, we again discussed about possible modalities for treatment for Graves' disease to include continuing methimazole, RAI treatment, or last resort, thyroidectomy.  Due to the variability in her TFTs on methimazole, at last visit  I suggested RAI treatment.  However, she wanted to avoid this.  She wanted to continue with methimazole but discussed that it is paramount to follow up with labs approximately 5 weeks after the dose change.  She agreed to do so.  She did return for labs in 01/2021and at that time her TSH was slightly high so we stopped methimazole at that time she again did not return for labs afterwards ... -At this point, we will recheck her TFTs and adjust the methimazole accordingly, but I again recommended RAI tx, but she is reticent to proceed with this.  Also, we may need Dr. 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  2.  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 Ralene Muskrat  at [facility] >> she started diclofenac and this was helping >> now off. Solumedrol and Alveda Reasons considered next, but currently not needed. She will see a new ophthalmology, referred by Dr. Toni Arthurs.  3. Vit D def -She has a  history of vitamin D deficiency.  Before last visit I advised her to increase the vitamin D dose to 4000 units daily, but she was taking only 2000 units by mistake. -On this dose, vitamin D level was normal, so we continued it.  At this visit, she tells me that she has actually missed the dose for the last few weeks.  I again encouraged her to take it every day. -We will recheck her level today.  Orders Placed This Encounter  Procedures  . TSH  . T4, free  . T3, free  . Thyroid stimulating immunoglobulin  . VITAMIN D 25 Hydroxy (Vit-D Deficiency, Fractures)   Component     Latest Ref Rng & Units 08/15/2019  TSH     0.35 - 4.50 uIU/mL <0.01 (L)  T4,Free(Direct)     0.60 - 1.60 ng/dL 1.61  Triiodothyronine,Free,Serum     2.3 - 4.2 pg/mL 4.6 (H)  TSI     <140 % baseline 450 (H)  Vitamin D, 25-Hydroxy     30.0 - 100.0 ng/mL 52.1   TSI's are elevated.  TSH is suppressed, free T4 elevated.We will restart 5 mg of methimazole daily and recheck her test in 1.5 months. Vitamin D level is normal.  Sandra Pavlov, MD PhD [facility] Endocrinology

## 2019-08-15 NOTE — Patient Instructions (Addendum)
Please continue off Methimazole.  Use Atenolol 25 mg daily at night.  Please come back for a follow-up appointment in 6 months.  Marland Kitchen

## 2019-08-16 DIAGNOSIS — H43822 Vitreomacular adhesion, left eye: Secondary | ICD-10-CM | POA: Diagnosis not present

## 2019-08-16 DIAGNOSIS — H259 Unspecified age-related cataract: Secondary | ICD-10-CM | POA: Diagnosis not present

## 2019-08-16 DIAGNOSIS — H16223 Keratoconjunctivitis sicca, not specified as Sjogren's, bilateral: Secondary | ICD-10-CM | POA: Diagnosis not present

## 2019-08-16 DIAGNOSIS — H40013 Open angle with borderline findings, low risk, bilateral: Secondary | ICD-10-CM | POA: Diagnosis not present

## 2019-08-16 LAB — SPECIMEN STATUS REPORT

## 2019-08-16 LAB — VITAMIN D 25 HYDROXY (VIT D DEFICIENCY, FRACTURES): Vit D, 25-Hydroxy: 52.1 ng/mL (ref 30.0–100.0)

## 2019-08-17 ENCOUNTER — Encounter: Payer: Self-pay | Admitting: Internal Medicine

## 2019-08-17 LAB — THYROID STIMULATING IMMUNOGLOBULIN: TSI: 450 % baseline — ABNORMAL HIGH (ref <140)

## 2019-08-17 MED ORDER — METHIMAZOLE 5 MG PO TABS: 5.0000 mg | ORAL_TABLET | Freq: Every day | ORAL | 1 refills | Status: DC

## 2019-09-27 ENCOUNTER — Other Ambulatory Visit: Payer: Self-pay

## 2019-09-27 ENCOUNTER — Other Ambulatory Visit: Payer: Self-pay | Admitting: Internal Medicine

## 2019-09-27 ENCOUNTER — Ambulatory Visit: Payer: BC Managed Care – PPO

## 2019-09-27 DIAGNOSIS — E0500 Thyrotoxicosis with diffuse goiter without thyrotoxic crisis or storm: Secondary | ICD-10-CM

## 2019-09-27 LAB — T3, FREE: T3, Free: 3.2 pg/mL (ref 2.3–4.2)

## 2019-09-27 LAB — T4, FREE: Free T4: 0.67 ng/dL (ref 0.60–1.60)

## 2019-09-27 LAB — TSH: TSH: 0.01 u[IU]/mL — ABNORMAL LOW (ref 0.35–4.50)

## 2019-09-28 ENCOUNTER — Other Ambulatory Visit: Payer: Self-pay

## 2019-10-08 DIAGNOSIS — H259 Unspecified age-related cataract: Secondary | ICD-10-CM | POA: Diagnosis not present

## 2019-10-08 DIAGNOSIS — H40013 Open angle with borderline findings, low risk, bilateral: Secondary | ICD-10-CM | POA: Diagnosis not present

## 2019-10-08 DIAGNOSIS — H16223 Keratoconjunctivitis sicca, not specified as Sjogren's, bilateral: Secondary | ICD-10-CM | POA: Diagnosis not present

## 2019-10-08 DIAGNOSIS — H04213 Epiphora due to excess lacrimation, bilateral lacrimal glands: Secondary | ICD-10-CM | POA: Diagnosis not present

## 2019-11-14 ENCOUNTER — Other Ambulatory Visit: Payer: BC Managed Care – PPO

## 2019-11-16 ENCOUNTER — Other Ambulatory Visit: Payer: Self-pay | Admitting: Internal Medicine

## 2019-11-16 ENCOUNTER — Other Ambulatory Visit (INDEPENDENT_AMBULATORY_CARE_PROVIDER_SITE_OTHER): Payer: BC Managed Care – PPO

## 2019-11-16 ENCOUNTER — Other Ambulatory Visit: Payer: Self-pay

## 2019-11-16 DIAGNOSIS — E0500 Thyrotoxicosis with diffuse goiter without thyrotoxic crisis or storm: Secondary | ICD-10-CM

## 2019-11-16 LAB — T3, FREE: T3, Free: 3.1 pg/mL (ref 2.3–4.2)

## 2019-11-16 LAB — T4, FREE: Free T4: 0.64 ng/dL (ref 0.60–1.60)

## 2019-11-16 LAB — TSH: TSH: 3.63 u[IU]/mL (ref 0.35–4.50)

## 2019-11-21 DIAGNOSIS — H04123 Dry eye syndrome of bilateral lacrimal glands: Secondary | ICD-10-CM | POA: Diagnosis not present

## 2019-11-21 DIAGNOSIS — H2513 Age-related nuclear cataract, bilateral: Secondary | ICD-10-CM | POA: Diagnosis not present

## 2019-11-21 DIAGNOSIS — E063 Autoimmune thyroiditis: Secondary | ICD-10-CM | POA: Diagnosis not present

## 2019-12-19 ENCOUNTER — Other Ambulatory Visit: Payer: Self-pay | Admitting: Internal Medicine

## 2019-12-19 ENCOUNTER — Other Ambulatory Visit (INDEPENDENT_AMBULATORY_CARE_PROVIDER_SITE_OTHER): Payer: BC Managed Care – PPO

## 2019-12-19 ENCOUNTER — Other Ambulatory Visit: Payer: Self-pay

## 2019-12-19 DIAGNOSIS — E0500 Thyrotoxicosis with diffuse goiter without thyrotoxic crisis or storm: Secondary | ICD-10-CM | POA: Diagnosis not present

## 2019-12-19 LAB — T3, FREE: T3, Free: 3.2 pg/mL (ref 2.3–4.2)

## 2019-12-19 LAB — TSH: TSH: 11.12 u[IU]/mL — ABNORMAL HIGH (ref 0.35–4.50)

## 2019-12-19 LAB — T4, FREE: Free T4: 0.56 ng/dL — ABNORMAL LOW (ref 0.60–1.60)

## 2020-02-07 ENCOUNTER — Ambulatory Visit: Payer: BC Managed Care – PPO | Admitting: Internal Medicine

## 2020-02-12 ENCOUNTER — Ambulatory Visit (INDEPENDENT_AMBULATORY_CARE_PROVIDER_SITE_OTHER): Payer: BC Managed Care – PPO | Admitting: Internal Medicine

## 2020-02-12 ENCOUNTER — Other Ambulatory Visit: Payer: Self-pay

## 2020-02-12 ENCOUNTER — Encounter: Payer: Self-pay | Admitting: Internal Medicine

## 2020-02-12 VITALS — BP 140/88 | HR 76 | Ht 64.0 in | Wt 131.8 lb

## 2020-02-12 DIAGNOSIS — E559 Vitamin D deficiency, unspecified: Secondary | ICD-10-CM

## 2020-02-12 DIAGNOSIS — E0500 Thyrotoxicosis with diffuse goiter without thyrotoxic crisis or storm: Secondary | ICD-10-CM

## 2020-02-12 LAB — TSH: TSH: <0.01 u[IU]/mL — ABNORMAL LOW (ref 0.35–4.50)

## 2020-02-12 LAB — T3, FREE: T3, Free: 4.3 pg/mL — ABNORMAL HIGH (ref 2.3–4.2)

## 2020-02-12 LAB — T4, FREE: Free T4: 1.15 ng/dL (ref 0.60–1.60)

## 2020-02-12 NOTE — Progress Notes (Signed)
Patient ID: Sandra Murray, female   DOB: 04-Sep-1960, 59 y.o.   MRN: [id number]   This visit occurred during the SARS-CoV-2 public health emergency.  Safety protocols were in place, including screening questions prior to the visit, additional usage of staff PPE, and extensive cleaning of exam room while observing appropriate contact time as indicated for disinfecting solutions.   HPI  Sandra Murray is a 59 y.o.-year-old female, initially referred by her gastroenterologist, Dr. Loreta Ave, returning for follow-up for Graves' disease.  Last visit 6 months ago.  She will retire in 07/2019. She has a very high stress job.  Reviewed and addended history: She started to have some  weight loss and bone pain in 2018 >> presented to see PCP  - TSH was undetectable. She was sent to a rheumatologist for suspicion for RA >> testing was negative.  No intervention was suggested for her abnormal thyroid tests then.  In 10/2017, she started to have: - palpitations - whooshing sound in year - lost weight (also very stressed as she is taking care of her father who has dementia) - tremors - anxiety - insomnia  She presented to see her PCP (Dr. Leavy Cella) >> TSH was again undetectable.  Again, no intervention was suggested.  She then saw Dr. Loreta Ave for a screening colonoscopy >> Dr. Loreta Ave referred her to me urgently for her thyrotoxicosis.  We initially started: -Methimazole 10 mg 2x a day -Atenolol 25 mg daily She did not return for repeat TFTs 1 month after starting the medication (miscommunication?).  However, when she returned to the clinic for another she was telling me that she felt much better.  She gained weight, did not have palpitations or tremors.  At that time, her TSH was very high, at 46.86.  We decrease the methimazole to 5 mg daily and ended up stopping it completely in 06/2018.  At that time, we also started to taper down atenolol and then stop completely.  Subsequent TFTs were normal in  07/2018, however, she called [place] that she started to have hyperthyroid symptoms again and we restarted a low-dose methimazole, 5 mg daily.  However, in 09/2018, her TSH was again high, at 15.86 I will stop methimazole.  She did not return for repeat labs, and presented to the emergency room in 01/2019 with tachycardia, palpitations, and the TSH was found to be undetectable.  We restarted methimazole, at 5 mg twice a day.  She called [place] in 08/2018 for diplopia and I directed her to her ophthalmologist.  She actually saw a optometrist who noticed an abnormality in her gaze and told her that this is possibly related to the thyroid condition, but did not suggest any other follow-up or treatment.  I referred her to Dr. Toni Arthurs and she did establish care with her.  She initialy had diplopia especially when looking up, then much better on diclofenac - now stopped.  02/2019: Reduced methimazole dose to 5 mg daily  03/2019: We stopped methimazole as TSH was elevated.  07/2019: We restarted methimazole 5 mg daily  11/2019: Stopped methimazole as TSH was again elevated.  I reviewed her TFTs: Lab Results  Component Value Date   TSH 11.12 (H) 12/19/2019   TSH 3.63 11/16/2019   TSH 0.01 (L) 09/27/2019   TSH <0.01 (L) 08/15/2019   TSH 7.54 (H) 04/20/2019   TSH 2.65 02/27/2019   TSH <0.010 (L) 01/27/2019   TSH 15.86 (H) 10/09/2018   TSH 0.47 07/21/2018   TSH 45.41 (H) 06/27/2018  FREET4 0.56 (L) 12/19/2019   FREET4 0.64 11/16/2019   FREET4 0.67 09/27/2019   FREET4 1.32 08/15/2019   FREET4 0.48 (L) 04/20/2019   FREET4 0.40 (L) 02/27/2019   FREET4 1.62 (H) 01/27/2019   FREET4 0.40 (L) 10/09/2018   FREET4 0.75 07/21/2018   FREET4 <0.25 (L) 06/27/2018   T3FREE 3.2 12/19/2019   T3FREE 3.1 11/16/2019   T3FREE 3.2 09/27/2019   T3FREE 4.6 (H) 08/15/2019   T3FREE 2.9 04/20/2019   T3FREE 3.3 02/27/2019   T3FREE 2.9 10/09/2018   T3FREE 4.7 (H) 07/21/2018   T3FREE 2.4 06/27/2018   T3FREE 2.0 (L)  06/09/2018  02/08/2018: TSH <0.010 12/15/2016: TSH <0.010 12/18/2015: TSH 2.192  Her TSI's were still elevated at last check: Lab Results  Component Value Date   TSI 450 (H) 08/15/2019   TSI 389 (H) 10/09/2018   TSI 504 (H) 02/24/2018   Pt denies: - feeling nodules in neck - hoarseness - dysphagia - choking - SOB with lying down  No FH of thyroid disease or cancer. No h/o radiation tx to head or neck.  No seaweed or kelp. No recent contrast studies. No herbal supplements. No Biotin use. No recent steroids use.   Vitamin deficiency   Reviewed vitamin D levels: Lab Results  Component Value Date   VD25OH 52.1 08/15/2019   VD25OH 36.48 02/27/2019   VD25OH 28.27 (L) 10/09/2018  - level of 14 on 02/08/2018  - level of 12 on 12/18/2015  She is on 2000 units vitamin D daily.  We continued this dose but at last visit she was missed the previous 2 weeks  She also has a history of anemia.  ROS: Constitutional: no weight gain/+ weight loss (too busy to eat), no fatigue, no subjective hyperthermia, no subjective hypothermia Eyes: no blurry vision, no xerophthalmia ENT: no sore throat, + see HPI Cardiovascular: no CP/no SOB/+ palpitations - when stressed/no leg swelling Respiratory: no cough/no SOB/no wheezing Gastrointestinal: no N/no V/no D/no C/no acid reflux Musculoskeletal: no muscle aches/no joint aches Skin: no rashes, no hair loss Neurological: no Tremors/no numbness/no tingling/no dizziness  I reviewed pt's medications, allergies, PMH, social hx, family hx, and changes were documented in the history of present illness. Otherwise, unchanged from my initial visit note.  PMH: -See above  No past surgical history  Social History   Socioeconomic History  . Marital status: Married    Spouse name: Not on file  . Number of children: 0  . Years of education: Not on file  . Highest education level: Not on file  Occupational History  . Director of [employer]   Tobacco Use  . Smoking status: Never Smoker  . Smokeless tobacco: Never Used  Substance and Sexual Activity  . Alcohol use: No  . Drug use: No   Current Outpatient Medications  Medication Sig Dispense Refill  . Ascorbic Acid (VITAMIN C) 100 MG tablet Take 1 tablet by mouth daily.    Marland Kitchen atenolol (TENORMIN) 25 MG tablet Take 1 tablet (25 mg total) by mouth daily. 60 tablet 3  . Cholecalciferol (VITAMIN D3) 25 MCG (1000 UT) CAPS Take by mouth. Take 1 tablet by mouth three times weekly.    . riboflavin (VITAMIN B-2) 100 MG TABS tablet Take 1 tablet by mouth daily.    . Selenium 200 MCG CAPS Take 1 tablet by mouth 2 (two) times daily.     No current facility-administered medications for this visit.    No Known Allergies   Family History  Problem Relation Age of Onset  . Stroke Mother   . Heart disease Mother   . Alzheimer's disease Father     Mother  - heart ds Father  - Prostate cancer  PE: BP 140/88   Pulse 76   Ht 5\' 4"  (1.626 m)   Wt 131 lb 12.8 oz (59.8 kg)   SpO2 99%   BMI 22.62 kg/m  Wt Readings from Last 3 Encounters:  02/12/20 131 lb 12.8 oz (59.8 kg)  08/15/19 134 lb (60.8 kg)  02/27/19 142 lb (64.4 kg)   Constitutional: normal weight, in NAD Eyes: PERRLA, EOMI, + exophthalmos, + stare ENT: moist mucous membranes, no thyromegaly, no cervical lymphadenopathy Cardiovascular: RRR, No RG, +1/6 SEM Respiratory: CTA B Gastrointestinal: abdomen soft, NT, ND, BS+ Musculoskeletal: no deformities, strength intact in all 4 Skin: moist, warm, no rashes Neurological: no tremor with outstretched hands, DTR normal in all 4  ASSESSMENT: 1. Graves ds.  2.  Diplopia  3. Vit D deficiency  PLAN:  1. Patient with history of low TSH, confirmed as Graves' disease based on high TSI antibodies.  She initially had thyrotoxic symptoms: Weight loss, palpitations, tremors, anxiety, which resolved after starting methimazole.  We discussed in the past about possible consequences of  uncontrolled thyrotoxicosis to include arrhythmia, heart failure, hypercoagulability with increased risk of stroke, PE and MI, sudden death.  She finally agreed to stop methimazole and atenolol, however, she did not return for labs in the next TSH returned very high at 47.  At that time, we decreased her methimazole dose to 5 mg daily and ended up stopping the methimazole completely in 06/2018.  Her TFTs normalized in 07/2018, however, she had return of thyrotoxicosis symptoms and we had to restart low-dose methimazole 5 mg daily.   In 09/2018, her TSH was again high, at 15.86 so we again stoppred methimazole.  However, she presented to the emergency room with tachycardia and palpitations at the beginning of 01/2019.  We restarted methimazole at that time.  She is currently off methimazole, however, after her TSH returned elevated in 11/2019. -At our visit in 02/2020 I suggested RAI treatment but she wanted to continue with methimazole. -She has variability in her TFTs, which was in the past due to not coming for labs at the recommended intervals.  However, since last visit, she came for labs as advised. -At last visit and again today, since her TFTs continue to fluctuate, I recommended RAI treatment but she continues to be reticent to get this.  Discussed about the procedure and what she should expect afterwards.I also gave her written instructions about this-please see patient instructions.  We did discuss that we may need Dr. 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  However, patient tells me that she does not think that she would want to have RAI treatment, but wants to continue with methimazole for now.  I advised her that we can also proceed with thyroidectomy.  Discussed surgical and transaxillary thyroidectomy.  She will think about it and also discussed with a friend who had thyroidectomy in the past. -In the past she had diplopia with upward gaze, and she does  have exophthalmos but no chemosis, eye pain. I referred her to Dr. Ralene Muskrat at [facility] >> she started diclofenac and this was helping >> now off. Solumedrol and Alveda Reasons considered next, but currently not needed.  She had a recent eye exam with Dr. Toni Arthurs in 11/2019  and she was given again after the beginning of the year. -She was able to come off atenolol in the past but restarted as she was having high heart rates at night.  Heart rate normal at this visit. -For now, we will recheck her TFTs and adjust the methimazole dose accordingly.  We may need to stay on 2.5 mg daily, in case we need to restart. -I will see her back in 6 months, but most likely sooner for labs  2.  Vit D def -She has a history of vitamin D deficiency -Continues on 2000 units vitamin D daily.  At last visit, she was missing doses and I advised her to take it every day.. -Reviewed latest vitamin D level: Normal, at 52, in 07/2019 -We will repeat this at next visit  Component     Latest Ref Rng & Units 02/12/2020  TSH     0.35 - 4.50 uIU/mL <0.01 (L)  T4,Free(Direct)     0.60 - 1.60 ng/dL 1.61  Triiodothyronine,Free,Serum     2.3 - 4.2 pg/mL 4.3 (H)   TSH is again undetectable while the free T3 is slightly high. I would advise her to restart methimazole but at the lower dose, 2.5 mg daily and recheck her tests in 4-5 weeks.  Carlus Pavlov, MD PhD [facility] Endocrinology

## 2020-02-12 NOTE — Patient Instructions (Addendum)
Please continue off methimazole for now.  Please stop at the lab.  Please come back for a follow-up appointment in 6 months.   Radioiodine (I-131) Therapy for Hyperthyroidism Radioiodine (I-131) therapy is a treatment for an overactive thyroid gland (hyperthyroidism). The thyroid is a gland in the neck that uses iodine to help control how the body uses food (metabolism). This treatment involves swallowing a pill or liquid that contains I-131. I-131 is manufactured (synthetic) iodine that gives off radiation. After it is swallowed, the I-131 will be absorbed by the thyroid gland over the next few months. It will destroy thyroid cells and reverse hyperthyroidism. Tell a health care provider about:  Any allergies you have.  All medicines you are taking, including vitamins, herbs, eye drops, creams, and over-the-counter medicines.  Any blood disorders you have.  Any surgeries you have had.  Any medical conditions you have.  Whether you are pregnant, may be pregnant, or have gone through menopause, if this applies.  Whether you currently have children.  Whether you are breastfeeding.  Whether you plan to have children in the next 2 years.  Any contact you have with children or pregnant women.  Your travel plans for the next 3 months.  Whether you pass through radiation detectors for work or travel. What are the risks? Generally, this is a safe procedure. However, problems may occur, including:  Damage to other structures or organs, such as the salivary glands. This could lead to dry mouth and loss of taste.  Low sperm count, if this applies. This may lead to temporary infertility.  Sore throat or neck pain. This is temporary.  Slightly increased risk of thyroid cancer.  Nausea or vomiting. What happens before the procedure? Staying hydrated  Follow instructions from your health care provider about hydration, which may include: ? Up to 2 hours before the procedure - you  may continue to drink clear liquids, such as water, clear fruit juice, black coffee, and plain tea. Eating and drinking restrictions  Follow instructions from your health care provider about eating and drinking restrictions.  Follow a low-iodine diet as told by your health care provider. Check ingredients on packaged foods and drinks because there are foods that you will need to avoid while on the low-iodine diet: ? Avoid iodized table salt and foods that have iodized salt. ? Avoid seafood, seaweed, soybeans, and soy products. ? Avoid dairy products and eggs. ? Avoid the food dye Red No. 3 because it has iodine. Medicines   Ask your health care provider about: ? Changing or stopping your regular medicines. This is especially important if you are taking diabetes medicines, blood thinners, or thyroid medicines. ? Taking over-the-counter medicines, vitamins, herbs, and supplements. General instructions  Women may be asked to take a pregnancy test.  Women who are breastfeeding should: ? Plan to stop at least 6 weeks before the procedure. ? Not go back to breastfeeding after the procedure until their health care provider approves.  Plan to avoid contact with other people for 1 week after your treatment. Avoiding contact with children and pregnant women is especially important. To do this, plan to stay home from work, arrange child care, and sleep alone, if these things apply to you.  Plan to drive yourself home after treatment. Do not take public transportation. If you need someone to drive you home, sit as far away from the driver as possible. What happens during the procedure?  You will be given a dose of I-131 to  swallow. It may be a pill or a liquid.  Your thyroid gland will absorb the I-131 over the next 3 months. The treatment process will be complete in about 6 months. What happens after the procedure?  You may need to stay in the hospital for 24 hours after your treatment. This  depends on the requirements in your state.  Follow instructions from your health care provider about: ? How to take care of yourself after the procedure. ? How to protect others from exposure to radiation as it leaves your body. Summary  Radioiodine (I-131) therapy is a treatment for an overactive thyroid gland (hyperthyroidism).  This treatment involves swallowing a pill or liquid that contains I-131. I-131 is manufactured iodine that gives off radiation.  Your thyroid gland will absorb the I-131 over the next 3 months. The I-131 destroys thyroid cells and reverses hyperthyroidism.  Follow instructions from your health care provider about how to take care of yourself and how to protect other people from exposure to radiation after the procedure. This information is not intended to replace advice given to you by your health care provider. Make sure you discuss any questions you have with your health care provider. Document Revised: 04/20/2018 Document Reviewed: 04/20/2018 Elsevier Patient Education  2020 [employer].

## 2020-02-13 ENCOUNTER — Other Ambulatory Visit: Payer: Self-pay | Admitting: Internal Medicine

## 2020-02-13 DIAGNOSIS — N920 Excessive and frequent menstruation with regular cycle: Secondary | ICD-10-CM | POA: Diagnosis not present

## 2020-02-13 DIAGNOSIS — D500 Iron deficiency anemia secondary to blood loss (chronic): Secondary | ICD-10-CM | POA: Diagnosis not present

## 2020-02-13 DIAGNOSIS — Z0000 Encounter for general adult medical examination without abnormal findings: Secondary | ICD-10-CM | POA: Diagnosis not present

## 2020-02-13 DIAGNOSIS — I10 Essential (primary) hypertension: Secondary | ICD-10-CM | POA: Diagnosis not present

## 2020-02-13 DIAGNOSIS — Z1231 Encounter for screening mammogram for malignant neoplasm of breast: Secondary | ICD-10-CM | POA: Diagnosis not present

## 2020-02-13 DIAGNOSIS — E559 Vitamin D deficiency, unspecified: Secondary | ICD-10-CM | POA: Diagnosis not present

## 2020-02-13 DIAGNOSIS — D259 Leiomyoma of uterus, unspecified: Secondary | ICD-10-CM | POA: Diagnosis not present

## 2020-02-13 DIAGNOSIS — E0500 Thyrotoxicosis with diffuse goiter without thyrotoxic crisis or storm: Secondary | ICD-10-CM

## 2020-02-13 MED ORDER — METHIMAZOLE 5 MG PO TABS: 2.5000 mg | ORAL_TABLET | Freq: Every day | ORAL | 3 refills | Status: DC

## 2020-03-11 ENCOUNTER — Telehealth: Payer: Self-pay | Admitting: Internal Medicine

## 2020-03-11 NOTE — Telephone Encounter (Signed)
Patient called office on 12/20 about a lab bill she received from [facility], the bill was sent to the wrong insurance carrier.  12/21 I called [facility] lab and updated the insurance information with the current insurance and reached out to the patient and advised her per [facility] it would rebilled to [employer].

## 2020-03-17 DIAGNOSIS — Z1231 Encounter for screening mammogram for malignant neoplasm of breast: Secondary | ICD-10-CM | POA: Diagnosis not present

## 2020-03-19 ENCOUNTER — Other Ambulatory Visit: Payer: Self-pay

## 2020-03-19 ENCOUNTER — Other Ambulatory Visit: Payer: BC Managed Care – PPO

## 2020-03-20 DIAGNOSIS — E559 Vitamin D deficiency, unspecified: Secondary | ICD-10-CM | POA: Diagnosis not present

## 2020-03-20 DIAGNOSIS — D649 Anemia, unspecified: Secondary | ICD-10-CM | POA: Diagnosis not present

## 2020-03-20 DIAGNOSIS — R7989 Other specified abnormal findings of blood chemistry: Secondary | ICD-10-CM | POA: Diagnosis not present

## 2020-03-20 DIAGNOSIS — Z0000 Encounter for general adult medical examination without abnormal findings: Secondary | ICD-10-CM | POA: Diagnosis not present

## 2020-03-20 DIAGNOSIS — Z1321 Encounter for screening for nutritional disorder: Secondary | ICD-10-CM | POA: Diagnosis not present

## 2020-03-20 DIAGNOSIS — E0500 Thyrotoxicosis with diffuse goiter without thyrotoxic crisis or storm: Secondary | ICD-10-CM | POA: Diagnosis not present

## 2020-03-24 ENCOUNTER — Other Ambulatory Visit: Payer: BC Managed Care – PPO

## 2020-03-26 ENCOUNTER — Other Ambulatory Visit: Payer: Self-pay

## 2020-03-26 ENCOUNTER — Other Ambulatory Visit (INDEPENDENT_AMBULATORY_CARE_PROVIDER_SITE_OTHER): Payer: BC Managed Care – PPO

## 2020-03-26 DIAGNOSIS — E0500 Thyrotoxicosis with diffuse goiter without thyrotoxic crisis or storm: Secondary | ICD-10-CM

## 2020-03-26 LAB — T4, FREE: Free T4: 0.7 ng/dL (ref 0.60–1.60)

## 2020-03-26 LAB — T3, FREE: T3, Free: 3.1 pg/mL (ref 2.3–4.2)

## 2020-03-26 LAB — TSH: TSH: <0.01 u[IU]/mL — ABNORMAL LOW (ref 0.35–4.50)

## 2020-03-27 ENCOUNTER — Other Ambulatory Visit: Payer: Self-pay | Admitting: Internal Medicine

## 2020-03-27 DIAGNOSIS — E0500 Thyrotoxicosis with diffuse goiter without thyrotoxic crisis or storm: Secondary | ICD-10-CM

## 2020-03-28 DIAGNOSIS — R000 Tachycardia, unspecified: Secondary | ICD-10-CM | POA: Diagnosis not present

## 2020-03-28 DIAGNOSIS — E0590 Thyrotoxicosis, unspecified without thyrotoxic crisis or storm: Secondary | ICD-10-CM | POA: Diagnosis not present

## 2020-03-28 DIAGNOSIS — Z733 Stress, not elsewhere classified: Secondary | ICD-10-CM | POA: Diagnosis not present

## 2020-03-28 DIAGNOSIS — F419 Anxiety disorder, unspecified: Secondary | ICD-10-CM | POA: Diagnosis not present

## 2020-04-06 IMAGING — DX DG CHEST 2V
2 series · 2 of 2 positions shown · non-contrast
Comparison: None.

CLINICAL DATA: Chest pain and cardiac palpitations

EXAM:
CHEST - 2 VIEW

[w chest pa]
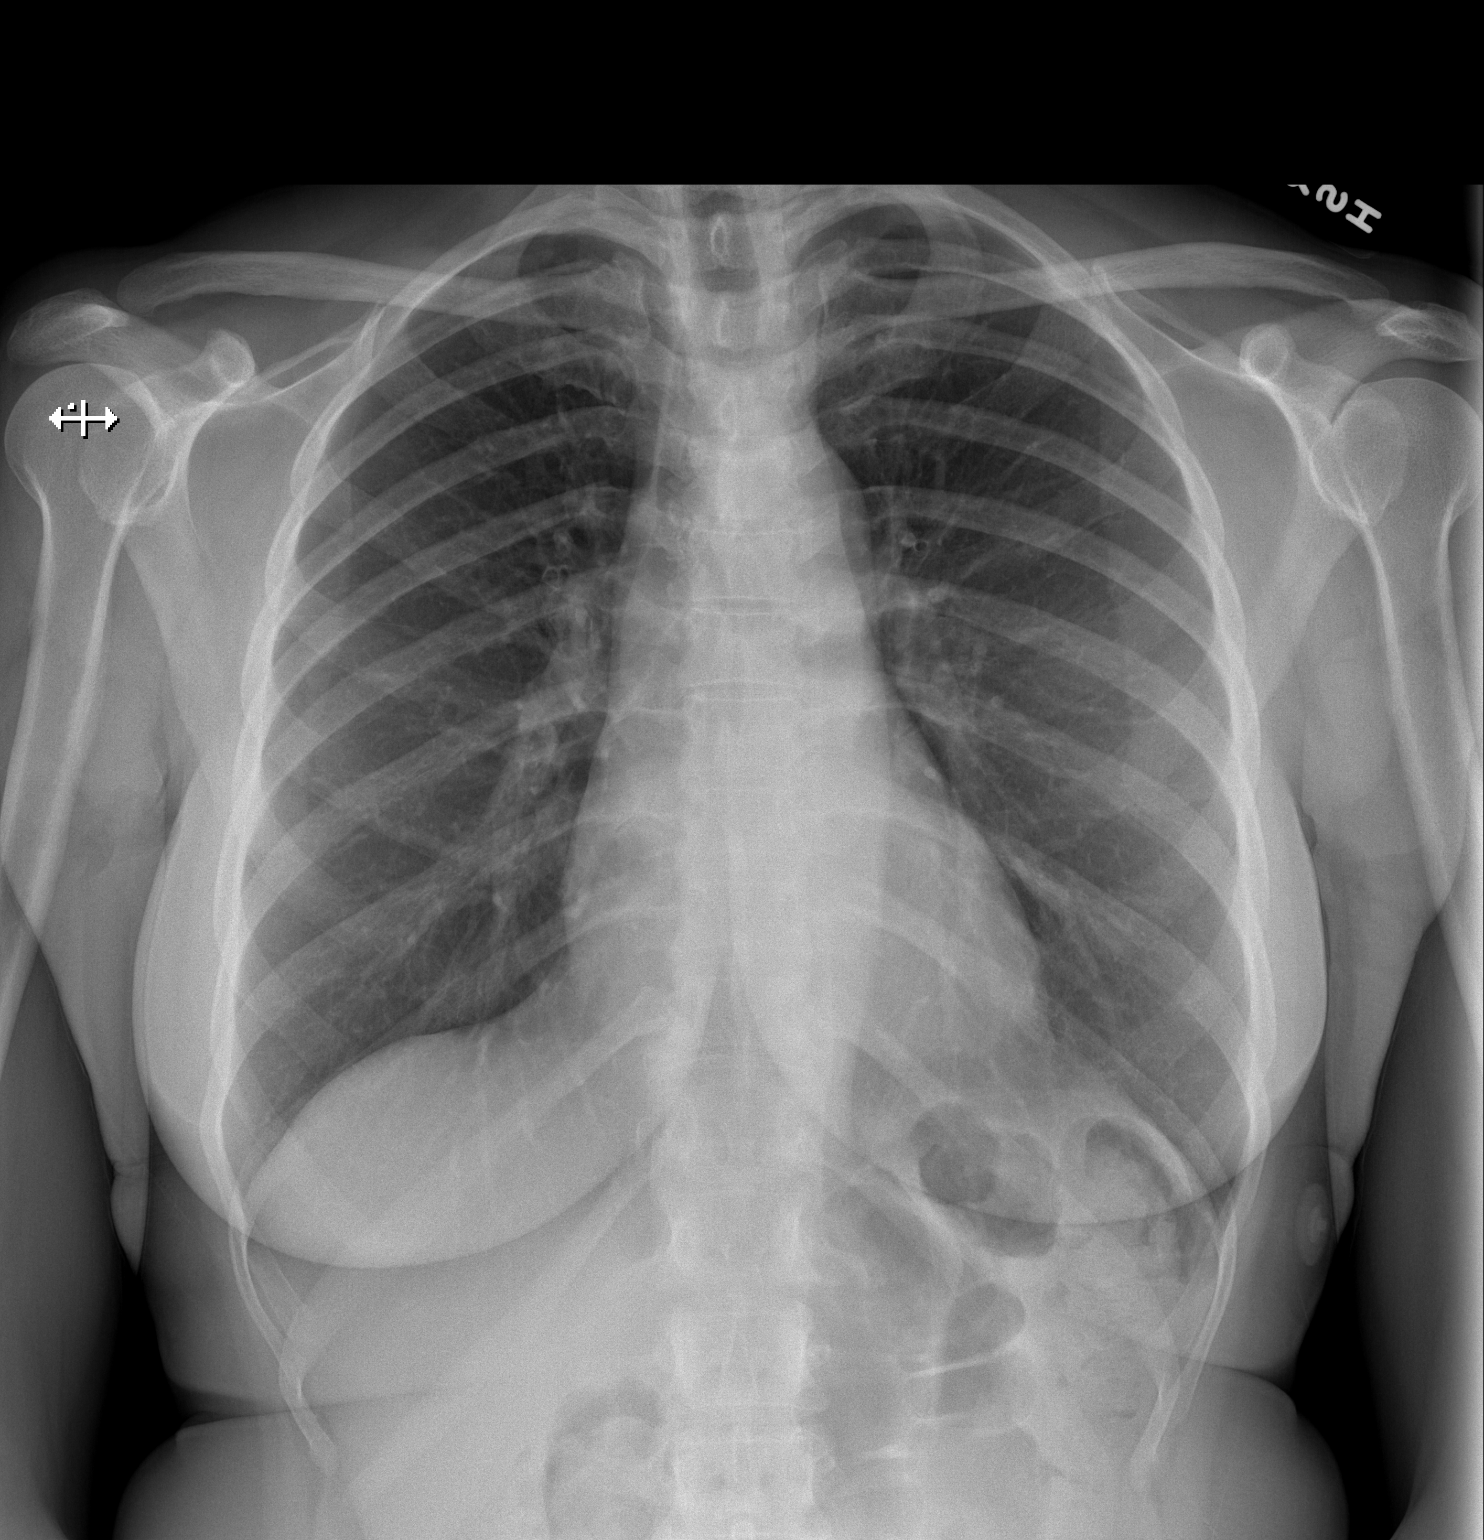

[w chest lat]
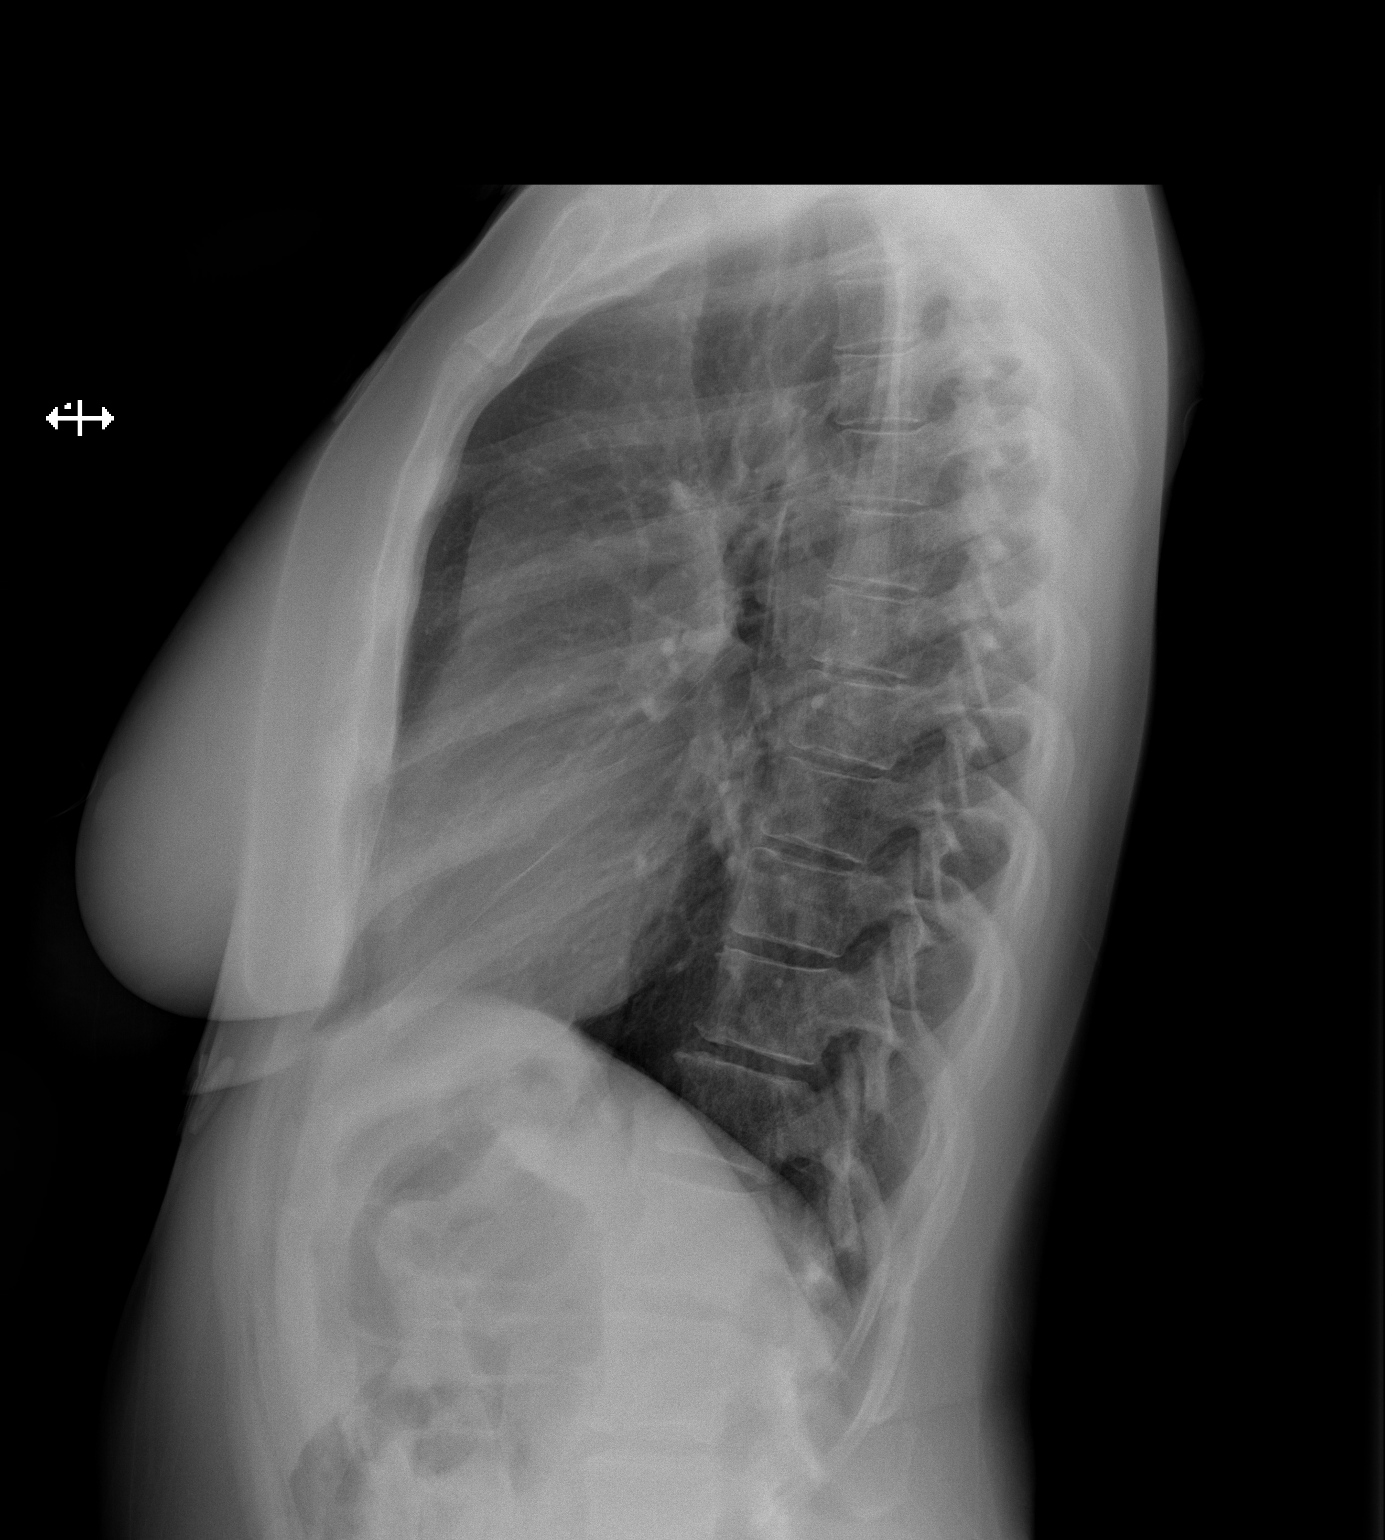

[2 of 2 positions shown; findings below may reference images not displayed]

FINDINGS: Lungs are clear. Heart size and pulmonary vascularity are normal. No
adenopathy. No pneumothorax. No bone lesions.
IMPRESSION: No edema or consolidation.

## 2020-04-22 ENCOUNTER — Other Ambulatory Visit: Payer: Self-pay

## 2020-04-22 ENCOUNTER — Encounter: Payer: Self-pay | Admitting: Cardiology

## 2020-04-22 ENCOUNTER — Ambulatory Visit: Payer: BC Managed Care – PPO | Admitting: Cardiology

## 2020-04-22 VITALS — BP 147/75 | HR 85 | Temp 98.0°F | Resp 16 | Ht 64.0 in | Wt 135.0 lb

## 2020-04-22 DIAGNOSIS — R0989 Other specified symptoms and signs involving the circulatory and respiratory systems: Secondary | ICD-10-CM

## 2020-04-22 DIAGNOSIS — R9431 Abnormal electrocardiogram [ECG] [EKG]: Secondary | ICD-10-CM | POA: Diagnosis not present

## 2020-04-22 DIAGNOSIS — E0500 Thyrotoxicosis with diffuse goiter without thyrotoxic crisis or storm: Secondary | ICD-10-CM | POA: Diagnosis not present

## 2020-04-22 DIAGNOSIS — R002 Palpitations: Secondary | ICD-10-CM

## 2020-04-22 NOTE — Progress Notes (Signed)
Date:  04/22/2020   ID:  Sandra Murray, DOB 1960/04/17, MRN [id number]  PCP:  Harvest Forest, MD  Cardiologist:  Tessa Lerner, DO, [facility] (established care 04/22/2020) Former Cardiology Providers: Dr. Wyline Mood.   REASON FOR CONSULT: Palpitations and Tachycardia.   REQUESTING PHYSICIAN:  Harvest Forest, MD [address] Raeanne Gathers [place],  [place] [address]  Chief Complaint  Patient presents with  . Palpitations  . New Patient (Initial Visit)    HPI  Sandra Murray is a 60 y.o. female who presents to the office with a chief complaint of " palpitations." Patient's past medical history and cardiovascular risk factors include: Graves disease.   She is referred to the office at the request of Harvest Forest, MD for evaluation of palpitations.  Patient states that she has been experiencing palpitations for the last several years.  Initially thought secondary to her underlying Graves' disease.  In the past she was started on atenolol which helped her symptoms.  Once her symptoms are well controlled atenolol was discontinued by her endocrinologist per patient.  Recently she started noticing palpitations and her smart watch informed her periodically that she has having an abnormal heartbeat.  She is concerned and therefore seeks further medical attention.  The symptoms lasted about 15 minutes, present every day, intermittent, worse with increased stress or being upset.  No improving factors.  Patient has not had any additional work-up in the past.  Her most recent TSH was less than 0.01 and is currently on methimazole.    FUNCTIONAL STATUS: No structured exercise program or daily routine.   ALLERGIES: No Known Allergies  MEDICATION LIST PRIOR TO VISIT: Current Meds  Medication Sig  . Cholecalciferol (VITAMIN D3) 25 MCG (1000 UT) CAPS Take by mouth. Take 1 tablet by mouth three times weekly.  . Ferrous Sulfate (SLOW FE PO)   . methimazole (TAPAZOLE) 5 MG tablet  Take 0.5 tablets (2.5 mg total) by mouth daily.  . riboflavin (VITAMIN B-2) 100 MG TABS tablet Take 1 tablet by mouth daily.  . Selenium 200 MCG CAPS Take 1 tablet by mouth 2 (two) times daily.     PAST MEDICAL HISTORY: Past Medical History:  Diagnosis Date  . Graves disease     PAST SURGICAL HISTORY: History reviewed. No pertinent surgical history.  FAMILY HISTORY: The patient family history includes Alzheimer's disease in her father; Heart disease in her mother; Stroke in her mother.  SOCIAL HISTORY:  The patient  reports that she has never smoked. She has never used smokeless tobacco. She reports that she does not drink alcohol and does not use drugs.  REVIEW OF SYSTEMS: Review of Systems  Constitutional: Negative for chills and fever.  HENT: Negative for hoarse voice and nosebleeds.   Eyes: Negative for discharge, double vision and pain.  Cardiovascular: Positive for palpitations. Negative for chest pain, claudication, dyspnea on exertion, leg swelling, near-syncope, orthopnea, paroxysmal nocturnal dyspnea and syncope.  Respiratory: Negative for hemoptysis and shortness of breath.   Musculoskeletal: Negative for muscle cramps and myalgias.  Gastrointestinal: Negative for abdominal pain, constipation, diarrhea, hematemesis, hematochezia, melena, nausea and vomiting.  Neurological: Negative for dizziness and light-headedness.   PHYSICAL EXAM: Vitals with BMI 04/22/2020 04/22/2020 02/12/2020  Height - 5\' 4"  5\' 4"   Weight - 135 lbs 131 lbs 13 oz  BMI - 23.16 22.61  Systolic 147 169 191  Diastolic 75 86 88  Pulse 85 84 76    CONSTITUTIONAL: Well-developed and well-nourished. No  acute distress.  SKIN: Skin is warm and dry. No rash noted. No cyanosis. No pallor. No jaundice HEAD: Normocephalic and atraumatic.  EYES: No scleral icterus MOUTH/THROAT: Moist oral membranes.  NECK: No JVD present. No thyromegaly noted.  Bilateral carotid bruits  LYMPHATIC: No visible cervical  adenopathy.  CHEST Normal respiratory effort. No intercostal retractions  LUNGS: Clear to auscultation bilaterally.  No stridor. No wheezes. No rales.  CARDIOVASCULAR: Regular rate and rhythm, positive S1-S2, no murmurs rubs or gallops appreciated. ABDOMINAL: No apparent ascites.  EXTREMITIES: No peripheral edema  HEMATOLOGIC: No significant bruising NEUROLOGIC: Oriented to person, place, and time. Nonfocal. Normal muscle tone.  PSYCHIATRIC: Normal mood and affect. Normal behavior. Cooperative  CARDIAC DATABASE: EKG: 04/22/2020: Normal sinus rhythm, 80 bpm, normal axis, T wave inversions in the anteroseptal leads consider possible ischemia, without underlying injury pattern.  Similar findings on prior EKGs.  Echocardiogram: No results found for this or any previous visit from the past 1095 days.  Stress Testing: No results found for this or any previous visit from the past 1095 days.  Heart Catheterization: None  LABORATORY DATA: CBC Latest Ref Rng & Units 01/27/2019 01/26/2019  WBC 4.0 - 10.5 K/uL 7.5 8.6  Hemoglobin 12.0 - 15.0 g/dL 10.9(L) 11.1(L)  Hematocrit 36.0 - 46.0 % 35.2(L) 36.4  Platelets 150 - 400 K/uL 264 257    CMP Latest Ref Rng & Units 01/27/2019 01/26/2019  Glucose 70 - 99 mg/dL 88 79  BUN 6 - 20 mg/dL 11 12  Creatinine 1.61 - 1.00 mg/dL 0.96 0.45  Sodium 409 - 145 mmol/L 140 137  Potassium 3.5 - 5.1 mmol/L 3.3(L) 3.7  Chloride 98 - 111 mmol/L 107 102  CO2 22 - 32 mmol/L 22 21(L)  Calcium 8.9 - 10.3 mg/dL 9.6 9.5    Lipid Panel  No results found for: CHOL, TRIG, HDL, CHOLHDL, VLDL, LDLCALC, LDLDIRECT, LABVLDL  No components found for: NTPROBNP No results for input(s): PROBNP in the last 8760 hours. Recent Labs    12/19/19 1142 02/12/20 1209 03/26/20 1518  TSH 11.12* <0.01* <0.01*    BMP No results for input(s): NA, K, CL, CO2, GLUCOSE, BUN, CREATININE, CALCIUM, GFRNONAA, GFRAA in the last 8760 hours.  HEMOGLOBIN A1C Lab Results  Component Value  Date   HGBA1C 11.0 02/08/2018   External Labs: Collected: 03/20/2020 at [facility] Creatinine 0.66 mg/dL. eGFR: >60 mL/min per 1.73 m Lipid profile: Total cholesterol 165, triglycerides 60, HDL 53, LDL 100 Hemoglobin 10.8, hematocrit 33.9 TSH: <0.01   IMPRESSION:    ICD-10-CM   1. Palpitations  R00.2 EKG 12-Lead    LONG TERM MONITOR-LIVE TELEMETRY (3-14 DAYS)  2. Nonspecific abnormal electrocardiogram (ECG) (EKG)  R94.31 PCV ECHOCARDIOGRAM COMPLETE  3. Graves disease  E05.00 LONG TERM MONITOR-LIVE TELEMETRY (3-14 DAYS)  4. Bilateral carotid bruits  R09.89 PCV CAROTID DUPLEX (BILATERAL)     RECOMMENDATIONS: Veora Fulgham is a 60 y.o. female whose past medical history and cardiac risk factors include: Graves' disease.  Palpitations:  EKG shows normal sinus rhythm without underlying injury pattern.  No known reversible causes.  I suspect that her symptoms of palpitation is most likely caused by her underlying Graves' disease which is currently being managed by endocrinology.  We discussed restarting atenolol which she has used in the past for symptom control or even considering propranolol.  However, patient would like to hold off on additional pharmacological therapy at this time.  Given the fact that she has hyperthyroidism which predisposes  her to atrial fibrillation I am concerned that the notification that she is getting on her smart watch may be due to paroxysmal atrial fibrillation.  We will check a 14-day mobile cardiac ambulatory telemetry to rule out the possibility of paroxysmal atrial fibrillation.  Outside labs independently reviewed.  Bilateral carotid bruits:  On physical examination patient has bilateral carotid bruits.  We will check a carotid duplex to rule out carotid artery atherosclerosis/stenosis.  Patient's blood pressure is elevated at today's office visit.  However, patient states that she has whitecoat hypertension  as when she checks her blood pressure at home systolic blood pressure ranges between 115--121 mmHg.  Patient is asked to keep a log of her blood pressures at home and if they start to trend upward discuss it further with PCP.  In addition, given the patient's age, symptoms, would recommend an exercise treadmill stress test once she is euthyroid.  Patient is agreeable with the plan of care as discussed above.   FINAL MEDICATION LIST END OF ENCOUNTER: No orders of the defined types were placed in this encounter.   Medications Discontinued During This Encounter  Medication Reason  . Ascorbic Acid (VITAMIN C) 100 MG tablet Error     Current Outpatient Medications:  .  Cholecalciferol (VITAMIN D3) 25 MCG (1000 UT) CAPS, Take by mouth. Take 1 tablet by mouth three times weekly., Disp: , Rfl:  .  Ferrous Sulfate (SLOW FE PO), , Disp: , Rfl:  .  methimazole (TAPAZOLE) 5 MG tablet, Take 0.5 tablets (2.5 mg total) by mouth daily., Disp: 30 tablet, Rfl: 3 .  riboflavin (VITAMIN B-2) 100 MG TABS tablet, Take 1 tablet by mouth daily., Disp: , Rfl:  .  Selenium 200 MCG CAPS, Take 1 tablet by mouth 2 (two) times daily., Disp: , Rfl:   Orders Placed This Encounter  Procedures  . LONG TERM MONITOR-LIVE TELEMETRY (3-14 DAYS)  . EKG 12-Lead  . PCV ECHOCARDIOGRAM COMPLETE  . PCV CAROTID DUPLEX (BILATERAL)    There are no Patient Instructions on file for this visit.   --Continue cardiac medications as reconciled in final medication list. --Return in about 4 weeks (around 05/20/2020). Or sooner if needed. --Continue follow-up with your primary care physician regarding the management of your other chronic comorbid conditions.  Patient's questions and concerns were addressed to her satisfaction. She voices understanding of the instructions provided during this encounter.   This note was created using a voice recognition software as a result there may be grammatical errors inadvertently enclosed that do not  reflect the nature of this encounter. Every attempt is made to correct such errors.  Tessa Lerner, [place], [facility]  Pager: [phone] Office: [phone]

## 2020-04-28 ENCOUNTER — Ambulatory Visit: Payer: BC Managed Care – PPO | Admitting: Cardiology

## 2020-04-28 ENCOUNTER — Other Ambulatory Visit: Payer: Self-pay

## 2020-04-28 ENCOUNTER — Inpatient Hospital Stay: Payer: BC Managed Care – PPO

## 2020-04-28 ENCOUNTER — Encounter: Payer: Self-pay | Admitting: Cardiology

## 2020-04-28 ENCOUNTER — Ambulatory Visit: Payer: BC Managed Care – PPO

## 2020-04-28 VITALS — BP 147/80 | HR 79 | Ht 64.0 in | Wt 133.0 lb

## 2020-04-28 DIAGNOSIS — R002 Palpitations: Secondary | ICD-10-CM

## 2020-04-28 DIAGNOSIS — R0989 Other specified symptoms and signs involving the circulatory and respiratory systems: Secondary | ICD-10-CM

## 2020-04-28 DIAGNOSIS — R9431 Abnormal electrocardiogram [ECG] [EKG]: Secondary | ICD-10-CM | POA: Diagnosis not present

## 2020-04-28 DIAGNOSIS — R072 Precordial pain: Secondary | ICD-10-CM

## 2020-04-28 DIAGNOSIS — E0500 Thyrotoxicosis with diffuse goiter without thyrotoxic crisis or storm: Secondary | ICD-10-CM | POA: Diagnosis not present

## 2020-04-28 NOTE — Progress Notes (Signed)
Date:  04/28/2020   ID:  Sandra Murray, DOB [birth year], MRN [id number]  PCP:  Harvest Forest, MD  Cardiologist:  Tessa Lerner, DO, [facility] (established care 04/22/2020) Former Cardiology Providers: Dr. Wyline Mood.   Date: 04/28/20 Last Office Visit: 04/22/2020  Chief Complaint  Patient presents with  . Chest Pain    HPI  Sandra Murray is a 60 y.o. female who presents to the office with a chief complaint of " chest pain." Patient's past medical history and cardiovascular risk factors include: Graves disease.   She is referred to the office at the request of Harvest Forest, MD for evaluation of palpitations.  Patient was worked in as a sick visit for evaluation of chest pain.  Patient came to the office earlier this morning for an echocardiogram and to have a monitor placed.  After the appointment she had gone back to work when she started feeling sudden onset of chest pain over the anterior chest wall at approximately 11:00 o'clock a.m.  The pain was not located substernally, not brought on by effort related activities in order to resolve with rest.  But the pain was severe enough for her to rest and to return to the office for further evaluation.  Patient states that she was advised by her husband to go to the ER but because of the COVID-19 pandemic she chose not to.  She denies any focal neurological deficits, no syncope, no falls, no vision changes.  At the time of the evaluation patient is chest pain had significantly resolved with some minor discomfort located posteriorly.  FUNCTIONAL STATUS: No structured exercise program or daily routine.   ALLERGIES: No Known Allergies  MEDICATION LIST PRIOR TO VISIT: Current Meds  Medication Sig  . Cholecalciferol (VITAMIN D3) 25 MCG (1000 UT) CAPS Take by mouth. Take 1 tablet by mouth three times weekly.  . Ferrous Sulfate (SLOW FE PO)   . methimazole (TAPAZOLE) 5 MG tablet Take 0.5 tablets (2.5 mg total) by mouth daily.  .  riboflavin (VITAMIN B-2) 100 MG TABS tablet Take 1 tablet by mouth daily.  . Selenium 200 MCG CAPS Take 1 tablet by mouth 2 (two) times daily.     PAST MEDICAL HISTORY: Past Medical History:  Diagnosis Date  . Graves disease     PAST SURGICAL HISTORY: No past surgical history on file.  FAMILY HISTORY: The patient family history includes Alzheimer's disease in her father; Heart disease in her mother; Stroke in her mother.  SOCIAL HISTORY:  The patient  reports that she has never smoked. She has never used smokeless tobacco. She reports that she does not drink alcohol and does not use drugs.  REVIEW OF SYSTEMS: Review of Systems  Constitutional: Negative for chills and fever.  HENT: Negative for hoarse voice and nosebleeds.   Eyes: Negative for discharge, double vision and pain.  Cardiovascular: Positive for chest pain and palpitations. Negative for claudication, dyspnea on exertion, leg swelling, near-syncope, orthopnea, paroxysmal nocturnal dyspnea and syncope.  Respiratory: Negative for hemoptysis and shortness of breath.   Musculoskeletal: Negative for muscle cramps and myalgias.  Gastrointestinal: Negative for abdominal pain, constipation, diarrhea, hematemesis, hematochezia, melena, nausea and vomiting.  Neurological: Negative for dizziness and light-headedness.   PHYSICAL EXAM: Vitals with BMI 04/28/2020 04/28/2020 04/28/2020  Height - - 5\' 4"   Weight - - 133 lbs  BMI - - 22.82  Systolic 147 148 086  Diastolic 80 78 79  Pulse 79 81 88    CONSTITUTIONAL:  Well-developed and well-nourished. No acute distress.  SKIN: Skin is warm and dry. No rash noted. No cyanosis. No pallor. No jaundice HEAD: Normocephalic and atraumatic.  EYES: No scleral icterus MOUTH/THROAT: Moist oral membranes.  NECK: No JVD present. No thyromegaly noted.  Bilateral carotid bruits  LYMPHATIC: No visible cervical adenopathy.  CHEST Normal respiratory effort. No intercostal retractions  LUNGS: Clear to  auscultation bilaterally.  No stridor. No wheezes. No rales.  CARDIOVASCULAR: Regular rate and rhythm, positive S1-S2, no murmurs rubs or gallops appreciated. ABDOMINAL: No apparent ascites.  EXTREMITIES: No peripheral edema.  2+ bilateral carotid pulses, radial pulses, femoral pulses, popliteal pulses, and dorsalis pedis and posterior tibial. HEMATOLOGIC: No significant bruising NEUROLOGIC: Oriented to person, place, and time. Nonfocal. Normal muscle tone.  PSYCHIATRIC: Normal mood and affect. Normal behavior. Cooperative  CARDIAC DATABASE: EKG: 04/28/2020: Sinus  Rhythm, 76bpm, normal axis, without underlying injury pattern.  Echocardiogram: 04/28/2020:  Left ventricle cavity is normal in size. Mild concentric hypertrophy of the left ventricle. Normal global wall motion. Normal LV systolic function with EF 61%. Normal diastolic filling pattern.  Structurally normal trileaflet aortic valve. No evidence of aortic stenosis. Moderate (Grade II) aortic regurgitation.  Structurally normal mitral valve. No evidence of mitral stenosis. Moderate (Grade II) mitral regurgitation.  No evidence of pulmonary hypertension.  Stress Testing: No results found for this or any previous visit from the past 1095 days.  Heart Catheterization: None  LABORATORY DATA: CBC Latest Ref Rng & Units 01/27/2019 01/26/2019  WBC 4.0 - 10.5 K/uL 7.5 8.6  Hemoglobin 12.0 - 15.0 g/dL 10.9(L) 11.1(L)  Hematocrit 36.0 - 46.0 % 35.2(L) 36.4  Platelets 150 - 400 K/uL 264 257    CMP Latest Ref Rng & Units 01/27/2019 01/26/2019  Glucose 70 - 99 mg/dL 88 79  BUN 6 - 20 mg/dL 11 12  Creatinine 1.61 - 1.00 mg/dL 0.96 0.45  Sodium 409 - 145 mmol/L 140 137  Potassium 3.5 - 5.1 mmol/L 3.3(L) 3.7  Chloride 98 - 111 mmol/L 107 102  CO2 22 - 32 mmol/L 22 21(L)  Calcium 8.9 - 10.3 mg/dL 9.6 9.5    Lipid Panel  No results found for: CHOL, TRIG, HDL, CHOLHDL, VLDL, LDLCALC, LDLDIRECT, LABVLDL  No components found for:  NTPROBNP No results for input(s): PROBNP in the last 8760 hours. Recent Labs    12/19/19 1142 02/12/20 1209 03/26/20 1518  TSH 11.12* <0.01* <0.01*    BMP No results for input(s): NA, K, CL, CO2, GLUCOSE, BUN, CREATININE, CALCIUM, GFRNONAA, GFRAA in the last 8760 hours.  HEMOGLOBIN A1C Lab Results  Component Value Date   HGBA1C 11.0 02/08/2018   External Labs: Collected: 03/20/2020 at [facility] Creatinine 0.66 mg/dL. eGFR: >60 mL/min per 1.73 m Lipid profile: Total cholesterol 165, triglycerides 60, HDL 53, LDL 100 Hemoglobin 10.8, hematocrit 33.9 TSH: <0.01   IMPRESSION:    ICD-10-CM   1. Precordial pain  R07.2 EKG 12-Lead  2. Palpitations  R00.2   3. Graves disease  E05.00   4. Bilateral carotid bruits  R09.89      RECOMMENDATIONS: Lojain Magdaleno is a 60 y.o. female whose past medical history and cardiac risk factors include: Graves' disease.  Precordial pain:  She is worked in for an acute visit.   EKG shows normal sinus rhythm without underlying injury pattern.  Echocardiogram from this morning independently reviewed EF is preserved with moderate aortic regurgitation and mild mitral regurgitation.  No significant change in bilateral blood pressure checks  in the upper extremity.  No focal neurological deficits, no vision changes, no syncope, no falls.  Patient is asked to keep a log of her blood pressures at home and to bring it in at the next office visit.  We discussed the differential diagnosis of acute chest pain and she is made aware of the possibilities of acute coronary syndrome as well as aortic syndromes.  Patient is asked to seek medical attention by going to the closest ER via EMS if her symptoms resurface or do not improve.  Patient verbalized understanding.  Palpitations:  I suspect that her symptoms of palpitation is most likely caused by her underlying Graves' disease which is currently being managed by  endocrinology.  We will check a 14-day mobile cardiac ambulatory telemetry to rule out the possibility of paroxysmal atrial fibrillation.  Bilateral carotid bruits:  On physical examination patient has bilateral carotid bruits.  We will check a carotid duplex to rule out carotid artery atherosclerosis/stenosis.  In addition, given the patient's age, symptoms, would recommend an exercise treadmill stress test once she is euthyroid.  Patient is agreeable with the plan of care as discussed above.   FINAL MEDICATION LIST END OF ENCOUNTER: No orders of the defined types were placed in this encounter.   There are no discontinued medications.   Current Outpatient Medications:  .  Cholecalciferol (VITAMIN D3) 25 MCG (1000 UT) CAPS, Take by mouth. Take 1 tablet by mouth three times weekly., Disp: , Rfl:  .  Ferrous Sulfate (SLOW FE PO), , Disp: , Rfl:  .  methimazole (TAPAZOLE) 5 MG tablet, Take 0.5 tablets (2.5 mg total) by mouth daily., Disp: 30 tablet, Rfl: 3 .  riboflavin (VITAMIN B-2) 100 MG TABS tablet, Take 1 tablet by mouth daily., Disp: , Rfl:  .  Selenium 200 MCG CAPS, Take 1 tablet by mouth 2 (two) times daily., Disp: , Rfl:   Orders Placed This Encounter  Procedures  . EKG 12-Lead    There are no Patient Instructions on file for this visit.   --Continue cardiac medications as reconciled in final medication list. --Return in about 4 weeks (around 05/26/2020) for Follow up re-evaluate palpitation, chest pain, review test results. . Or sooner if needed. --Continue follow-up with your primary care physician regarding the management of your other chronic comorbid conditions.  Patient's questions and concerns were addressed to her satisfaction. She voices understanding of the instructions provided during this encounter.   This note was created using a voice recognition software as a result there may be grammatical errors inadvertently enclosed that do not reflect the nature of this  encounter. Every attempt is made to correct such errors.  Tessa Lerner, [place], [facility]  Pager: [phone] Office: [phone]

## 2020-05-06 DIAGNOSIS — H2513 Age-related nuclear cataract, bilateral: Secondary | ICD-10-CM | POA: Diagnosis not present

## 2020-05-06 DIAGNOSIS — E063 Autoimmune thyroiditis: Secondary | ICD-10-CM | POA: Diagnosis not present

## 2020-05-06 DIAGNOSIS — H04123 Dry eye syndrome of bilateral lacrimal glands: Secondary | ICD-10-CM | POA: Diagnosis not present

## 2020-05-08 ENCOUNTER — Ambulatory Visit: Payer: BC Managed Care – PPO

## 2020-05-08 ENCOUNTER — Other Ambulatory Visit: Payer: Self-pay

## 2020-05-08 DIAGNOSIS — R0989 Other specified symptoms and signs involving the circulatory and respiratory systems: Secondary | ICD-10-CM

## 2020-05-11 ENCOUNTER — Other Ambulatory Visit: Payer: Self-pay | Admitting: Cardiology

## 2020-05-11 DIAGNOSIS — I6523 Occlusion and stenosis of bilateral carotid arteries: Secondary | ICD-10-CM

## 2020-05-20 ENCOUNTER — Ambulatory Visit: Payer: BC Managed Care – PPO | Admitting: Cardiology

## 2020-05-27 ENCOUNTER — Other Ambulatory Visit: Payer: Self-pay

## 2020-05-27 ENCOUNTER — Ambulatory Visit: Payer: BC Managed Care – PPO | Admitting: Cardiology

## 2020-05-27 ENCOUNTER — Encounter: Payer: Self-pay | Admitting: Cardiology

## 2020-05-27 VITALS — BP 153/77 | HR 77 | Temp 98.2°F | Resp 16 | Ht 64.0 in | Wt 134.0 lb

## 2020-05-27 DIAGNOSIS — R002 Palpitations: Secondary | ICD-10-CM | POA: Diagnosis not present

## 2020-05-27 DIAGNOSIS — R072 Precordial pain: Secondary | ICD-10-CM | POA: Diagnosis not present

## 2020-05-27 DIAGNOSIS — I472 Ventricular tachycardia: Secondary | ICD-10-CM | POA: Diagnosis not present

## 2020-05-27 DIAGNOSIS — I340 Nonrheumatic mitral (valve) insufficiency: Secondary | ICD-10-CM

## 2020-05-27 DIAGNOSIS — I10 Essential (primary) hypertension: Secondary | ICD-10-CM | POA: Diagnosis not present

## 2020-05-27 DIAGNOSIS — I351 Nonrheumatic aortic (valve) insufficiency: Secondary | ICD-10-CM

## 2020-05-27 DIAGNOSIS — F419 Anxiety disorder, unspecified: Secondary | ICD-10-CM | POA: Diagnosis not present

## 2020-05-27 DIAGNOSIS — Z733 Stress, not elsewhere classified: Secondary | ICD-10-CM | POA: Diagnosis not present

## 2020-05-27 DIAGNOSIS — E0500 Thyrotoxicosis with diffuse goiter without thyrotoxic crisis or storm: Secondary | ICD-10-CM

## 2020-05-27 DIAGNOSIS — R000 Tachycardia, unspecified: Secondary | ICD-10-CM | POA: Diagnosis not present

## 2020-05-27 DIAGNOSIS — Z712 Person consulting for explanation of examination or test findings: Secondary | ICD-10-CM

## 2020-05-27 DIAGNOSIS — I6523 Occlusion and stenosis of bilateral carotid arteries: Secondary | ICD-10-CM | POA: Diagnosis not present

## 2020-05-27 DIAGNOSIS — I4729 Other ventricular tachycardia: Secondary | ICD-10-CM

## 2020-05-27 NOTE — Progress Notes (Signed)
Date:  05/27/2020   ID:  Sandra Murray, DOB 12-Jun-1960, MRN [id number]  PCP:  Harvest Forest, MD  Cardiologist:  Tessa Lerner, DO, [facility] (established care 04/22/2020) Former Cardiology Providers: Dr. Wyline Mood.   Date: 05/27/20 Last Office Visit: 04/28/2020  Chief Complaint  Patient presents with  . Precordial pain  . Follow-up    HPI  Sandra Murray is a 60 y.o. female who presents to the office with a chief complaint of " reevaluation of chest pain and review test results." Patient's past medical history and cardiovascular risk factors include: Graves disease, bilateral asymptomatic carotid artery stenosis.  She is referred to the office at the request of Harvest Forest, MD for evaluation of palpitations.  Patient initially seen on April 22, 2020 for evaluation of palpitations.  However, she came in on April 28, 2020 after experiencing palpitations and chest pain.  Patient symptoms of chest pain appear to be atypical/noncardiac in origin.  She was reassured.  And her most recent echocardiogram that was performed was reviewed with her during her last office visit.  She was also educated regarding when to seek medical attention if her symptoms are precordial pain or to resurface.  Since last office visit patient has not had such severe chest pain.  She intermittently still continues to have precordial pain.  No hospitalizations or urgent care visits for cardiovascular symptoms.  Patient was initially referred to the office for evaluation of palpitations.  And in the setting of Graves' disease the possibility of atrial fibrillation had to be evaluated.  She underwent a 14-day mobile cardiac ambulatory telemetry which notes her underlying rhythm to be normal sinus without evidence of atrial fibrillation during the monitoring period.  Results of the monitor reviewed with her in great detail and noted below for further reference.  During her prior office visit she was also  noted to have carotid bruits bilaterally on physical examination.  As a result, carotid duplex was requested.  She is noted to have bilateral carotid artery stenosis but remains asymptomatic.  Recommended medical management and surveillance.  I also requested her to keep a log of her blood pressures as she may have undiagnosed hypertension at baseline.  Patient states that she did keep a log of her blood pressures for 1 week and her systolic blood pressures range between 124-147 mmHg and diastolic blood pressures range between 69-86 mmHg.  She has an upcoming appointment with her PCP later today at 4 PM to discuss this further.  However, given her underlying thyroid disease, palpitations, and elevated blood pressures she was started on atenolol 25 mg p.o. daily by her PCP at her last visit.  FUNCTIONAL STATUS: No structured exercise program or daily routine.   ALLERGIES: No Known Allergies  MEDICATION LIST PRIOR TO VISIT: Current Meds  Medication Sig  . atenolol (TENORMIN) 25 MG tablet Take 25 mg by mouth daily.  . Cholecalciferol (VITAMIN D3) 25 MCG (1000 UT) CAPS Take by mouth. Take 1 tablet by mouth three times weekly.  . Ferrous Sulfate (SLOW FE PO)   . methimazole (TAPAZOLE) 5 MG tablet Take 0.5 tablets (2.5 mg total) by mouth daily.  . riboflavin (VITAMIN B-2) 100 MG TABS tablet Take 1 tablet by mouth daily.  . Selenium 200 MCG CAPS Take 1 tablet by mouth 2 (two) times daily.     PAST MEDICAL HISTORY: Past Medical History:  Diagnosis Date  . Graves disease     PAST SURGICAL HISTORY: History reviewed. No  pertinent surgical history.  FAMILY HISTORY: The patient family history includes Alzheimer's disease in her father; Heart disease in her mother; Stroke in her mother.  SOCIAL HISTORY:  The patient  reports that she has never smoked. She has never used smokeless tobacco. She reports that she does not drink alcohol and does not use drugs.  REVIEW OF SYSTEMS: Review of Systems   Constitutional: Negative for chills and fever.  HENT: Negative for hoarse voice and nosebleeds.   Eyes: Negative for discharge, double vision and pain.  Cardiovascular: Positive for chest pain and palpitations. Negative for claudication, dyspnea on exertion, leg swelling, near-syncope, orthopnea, paroxysmal nocturnal dyspnea and syncope.  Respiratory: Negative for hemoptysis and shortness of breath.   Musculoskeletal: Negative for muscle cramps and myalgias.  Gastrointestinal: Negative for abdominal pain, constipation, diarrhea, hematemesis, hematochezia, melena, nausea and vomiting.  Neurological: Negative for dizziness and light-headedness.   PHYSICAL EXAM: Vitals with BMI 05/27/2020 05/27/2020 04/28/2020  Height - 5\' 4"  -  Weight - 134 lbs -  BMI - 22.99 -  Systolic 153 159 161  Diastolic 77 85 80  Pulse 77 73 79    CONSTITUTIONAL: Well-developed and well-nourished. No acute distress.  SKIN: Skin is warm and dry. No rash noted. No cyanosis. No pallor. No jaundice HEAD: Normocephalic and atraumatic.  EYES: No scleral icterus MOUTH/THROAT: Moist oral membranes.  NECK: No JVD present. No thyromegaly noted.  Bilateral carotid bruits  LYMPHATIC: No visible cervical adenopathy.  CHEST Normal respiratory effort. No intercostal retractions  LUNGS: Clear to auscultation bilaterally.  No stridor. No wheezes. No rales.  CARDIOVASCULAR: Regular rate and rhythm, positive S1-S2, no murmurs rubs or gallops appreciated. ABDOMINAL: No apparent ascites.  EXTREMITIES: No peripheral edema.  2+ bilateral carotid pulses, radial pulses, femoral pulses, popliteal pulses, and dorsalis pedis and posterior tibial. HEMATOLOGIC: No significant bruising NEUROLOGIC: Oriented to person, place, and time. Nonfocal. Normal muscle tone.  PSYCHIATRIC: Normal mood and affect. Normal behavior. Cooperative  CARDIAC DATABASE: EKG: 04/28/2020: Sinus  Rhythm, 76bpm, normal axis, without underlying injury  pattern.  Echocardiogram: 04/28/2020:  Left ventricle cavity is normal in size. Mild concentric hypertrophy of the left ventricle. Normal global wall motion. Normal LV systolic function with EF 61%. Normal diastolic filling pattern.  Structurally normal trileaflet aortic valve. No evidence of aortic stenosis. Moderate (Grade II) aortic regurgitation.  Structurally normal mitral valve. No evidence of mitral stenosis. Moderate (Grade II) mitral regurgitation.  No evidence of pulmonary hypertension.  Stress Testing: No results found for this or any previous visit from the past 1095 days.  Heart Catheterization: None  14 day mobile cardiac ambulatory telemetry:  Dominant rhythm normal sinus rhythm. Heart rate 59-159 bpm.  Avg HR 88 bpm. No atrial fibrillation, supraventricular tachycardia, high grade AV block, pauses (3 seconds or longer). One episode of nonsustained ventricular tachycardia, 5 beats in duration, average heart rate of 150 bpm, for 2.2 seconds at 5:11 AM on 05/08/2020. Total ventricular ectopic burden <1%. Total supraventricular ectopic burden <1%. Patient triggered events: 0.   LABORATORY DATA: CBC Latest Ref Rng & Units 01/27/2019 01/26/2019  WBC 4.0 - 10.5 K/uL 7.5 8.6  Hemoglobin 12.0 - 15.0 g/dL 10.9(L) 11.1(L)  Hematocrit 36.0 - 46.0 % 35.2(L) 36.4  Platelets 150 - 400 K/uL 264 257    CMP Latest Ref Rng & Units 01/27/2019 01/26/2019  Glucose 70 - 99 mg/dL 88 79  BUN 6 - 20 mg/dL 11 12  Creatinine 0.96 - 1.00 mg/dL 0.45 4.09  Sodium 811 -  145 mmol/L 140 137  Potassium 3.5 - 5.1 mmol/L 3.3(L) 3.7  Chloride 98 - 111 mmol/L 107 102  CO2 22 - 32 mmol/L 22 21(L)  Calcium 8.9 - 10.3 mg/dL 9.6 9.5    Lipid Panel  No results found for: CHOL, TRIG, HDL, CHOLHDL, VLDL, LDLCALC, LDLDIRECT, LABVLDL  No components found for: NTPROBNP No results for input(s): PROBNP in the last 8760 hours. Recent Labs    12/19/19 1142 02/12/20 1209 03/26/20 1518  TSH 11.12* <0.01*  <0.01*    BMP No results for input(s): NA, K, CL, CO2, GLUCOSE, BUN, CREATININE, CALCIUM, GFRNONAA, GFRAA in the last 8760 hours.  HEMOGLOBIN A1C Lab Results  Component Value Date   HGBA1C 11.0 02/08/2018   External Labs: Collected: 03/20/2020 at [facility] Creatinine 0.66 mg/dL. eGFR: >60 mL/min per 1.73 m Lipid profile: Total cholesterol 165, triglycerides 60, HDL 53, LDL 100 Hemoglobin 10.8, hematocrit 33.9 TSH: <0.01   IMPRESSION:    ICD-10-CM   1. Precordial pain  R07.2 SARS-COV-2 RNA,(COVID-19) QUAL NAAT    PCV CARDIAC STRESS TEST    CANCELED: PCV MYOCARDIAL PERFUSION WO LEXISCAN  2. NSVT (nonsustained ventricular tachycardia) (HCC)  I47.2 PCV CARDIAC STRESS TEST    CANCELED: PCV MYOCARDIAL PERFUSION WO LEXISCAN  3. Asymptomatic bilateral carotid artery stenosis  I65.23   4. Graves disease  E05.00   5. Moderate aortic regurgitation  I35.1   6. Moderate mitral regurgitation  I34.0   7. Encounter to discuss test results  Z71.2      RECOMMENDATIONS: Nani Duncan is a 60 y.o. female whose past medical history and cardiac risk factors include:  Graves disease, bilateral asymptomatic carotid artery stenosis.  Precordial pain:  Improved compared to the last visit.  Symptoms suggestive of possible noncardiac etiology.  Echocardiogram notes preserved LVEF with valvular heart disease.  The 14-day monitor that she recently had noted one episode of NSVT   Given her symptoms of precordial discomfort, cardiovascular risk factors, and monitor results the shared decision was to proceed with stress test.  Patient will be scheduled for exercise stress test.  COVID screen prior to GXT.  I have asked her to hold atenolol for 48 hours prior to the GXT.  Patient states that she plans to take some time off from work as she has a lot of stress related to her occupation, she plans to exercise more and eat  better.  Palpitations:  Improving.  Started atenolol 25 mg p.o. daily, as per PCPs recommendation.  14-day monitor results reviewed with her in great detail at today's visit.    Continue to monitor.    Asymptomatic bilateral carotid artery stenosis:  Carotid duplex results reviewed with the patient at today's visit along with the ultrasound images for further clarification.  Recommend aspirin and statin therapy.  However, patient would like to discuss this further with PCP.  Patient states that she has an appointment with her PCP later today at 4 PM.  I have provided her a copy of her carotid duplex for reference.    Follow-up study scheduled for 11/08/2020.  I like to see the patient back in 4 weeks to go over the results of the stress test as well as discuss her thoughts and/or questions regarding the results of the carotid duplex.  FINAL MEDICATION LIST END OF ENCOUNTER: No orders of the defined types were placed in this encounter.   There are no discontinued medications.   Current Outpatient Medications:  .  atenolol (  TENORMIN) 25 MG tablet, Take 25 mg by mouth daily., Disp: , Rfl:  .  Cholecalciferol (VITAMIN D3) 25 MCG (1000 UT) CAPS, Take by mouth. Take 1 tablet by mouth three times weekly., Disp: , Rfl:  .  Ferrous Sulfate (SLOW FE PO), , Disp: , Rfl:  .  methimazole (TAPAZOLE) 5 MG tablet, Take 0.5 tablets (2.5 mg total) by mouth daily., Disp: 30 tablet, Rfl: 3 .  riboflavin (VITAMIN B-2) 100 MG TABS tablet, Take 1 tablet by mouth daily., Disp: , Rfl:  .  Selenium 200 MCG CAPS, Take 1 tablet by mouth 2 (two) times daily., Disp: , Rfl:   Orders Placed This Encounter  Procedures  . SARS-COV-2 RNA,(COVID-19) QUAL NAAT  . PCV CARDIAC STRESS TEST    Patient Instructions  Hold beta blockers 48 hours before the stress test.    --Continue cardiac medications as reconciled in final medication list. --Return in about 4 weeks (around 06/24/2020) for Follow up, Review test  results, carotid disease. . Or sooner if needed. --Continue follow-up with your primary care physician regarding the management of your other chronic comorbid conditions.  Patient's questions and concerns were addressed to her satisfaction. She voices understanding of the instructions provided during this encounter.   This note was created using a voice recognition software as a result there may be grammatical errors inadvertently enclosed that do not reflect the nature of this encounter. Every attempt is made to correct such errors.  Tessa Lerner, [place], [facility]  Pager: [phone] Office: [phone]

## 2020-05-27 NOTE — Patient Instructions (Signed)
Hold beta blockers 48 hours before the stress test.

## 2020-06-09 DIAGNOSIS — I10 Essential (primary) hypertension: Secondary | ICD-10-CM | POA: Diagnosis not present

## 2020-06-09 DIAGNOSIS — R002 Palpitations: Secondary | ICD-10-CM | POA: Diagnosis not present

## 2020-06-09 DIAGNOSIS — I6529 Occlusion and stenosis of unspecified carotid artery: Secondary | ICD-10-CM | POA: Diagnosis not present

## 2020-06-09 DIAGNOSIS — E785 Hyperlipidemia, unspecified: Secondary | ICD-10-CM | POA: Diagnosis not present

## 2020-06-11 ENCOUNTER — Other Ambulatory Visit: Payer: BC Managed Care – PPO

## 2020-06-19 DIAGNOSIS — E785 Hyperlipidemia, unspecified: Secondary | ICD-10-CM | POA: Diagnosis not present

## 2020-06-19 DIAGNOSIS — D649 Anemia, unspecified: Secondary | ICD-10-CM | POA: Diagnosis not present

## 2020-06-24 ENCOUNTER — Ambulatory Visit: Payer: BC Managed Care – PPO | Admitting: Cardiology

## 2020-06-24 DIAGNOSIS — E785 Hyperlipidemia, unspecified: Secondary | ICD-10-CM | POA: Diagnosis not present

## 2020-06-24 DIAGNOSIS — Z1211 Encounter for screening for malignant neoplasm of colon: Secondary | ICD-10-CM | POA: Diagnosis not present

## 2020-06-24 DIAGNOSIS — D509 Iron deficiency anemia, unspecified: Secondary | ICD-10-CM | POA: Diagnosis not present

## 2020-06-30 ENCOUNTER — Ambulatory Visit: Payer: BC Managed Care – PPO

## 2020-06-30 ENCOUNTER — Other Ambulatory Visit: Payer: Self-pay

## 2020-06-30 ENCOUNTER — Other Ambulatory Visit (INDEPENDENT_AMBULATORY_CARE_PROVIDER_SITE_OTHER): Payer: BC Managed Care – PPO

## 2020-06-30 DIAGNOSIS — E0500 Thyrotoxicosis with diffuse goiter without thyrotoxic crisis or storm: Secondary | ICD-10-CM | POA: Diagnosis not present

## 2020-06-30 DIAGNOSIS — I472 Ventricular tachycardia: Secondary | ICD-10-CM | POA: Diagnosis not present

## 2020-06-30 DIAGNOSIS — R072 Precordial pain: Secondary | ICD-10-CM

## 2020-06-30 DIAGNOSIS — I4729 Other ventricular tachycardia: Secondary | ICD-10-CM

## 2020-06-30 LAB — TSH: TSH: 0.46 u[IU]/mL (ref 0.35–4.50)

## 2020-06-30 LAB — T3, FREE: T3, Free: 3 pg/mL (ref 2.3–4.2)

## 2020-06-30 LAB — T4, FREE: Free T4: 0.64 ng/dL (ref 0.60–1.60)

## 2020-07-02 NOTE — Progress Notes (Signed)
Date:  07/03/2020   ID:  Sandra Murray, DOB [birth year], MRN [id number]  PCP:  Harvest Forest, MD  Cardiologist: Tessa Lerner, DO, [facility] (established care 04/22/2020) Former Cardiology Providers: Dr. Wyline Mood.   Date: 07/03/20 Last Office Visit: 05/27/2020  Chief Complaint  Patient presents with  . Results  . Follow-up    4 week    HPI  Sandra Murray is a 60 y.o. female who presents to the office with a chief complaint of " 4 week follow for palpitation and carotid disease." Patient's past medical history and cardiovascular risk factors include: Graves disease, bilateral asymptomatic carotid artery stenosis.  She is referred to the office at the request of Harvest Forest, MD for evaluation of palpitations.  Given her symptoms of palpitations she underwent a extended Holter monitor which noted normal sinus rhythm without evidence of atrial fibrillation.  She did have one episode of nonsustained ventricular tachycardia.  She was started on atenolol 25 mg p.o. daily by her PCP which improved her symptoms.  In the setting of nonsustained ventricular tachycardia and chest discomfort though atypical/noncardiac in origin she was recommended to undergo stress testing.  At the last office visit.  Results reviewed with her at today's encounter.  During prior office encounter she was noted to have bilateral carotid bruits on auscultation.  She underwent carotid duplex thereafter and was noted to have bilateral asymptomatic carotid artery stenosis.  Recommended aspirin and statin therapy.  However patient at the last office visit wanted to discuss it further with PCP prior to starting pharmacological therapy.  She now presents for follow-up.  Since last office visit she discussed the carotid findings with her PCP who also agreed with initiation of statin therapy and aspirin.  She is tolerating medical therapy well.  Patient states her palpitations have improved and are less  frequent.  She is currently seen psychiatry for management of her anxiety.  She is also followed up with endocrinology and her thyroid disease is well managed.   FUNCTIONAL STATUS: No structured exercise program or daily routine.   ALLERGIES: No Known Allergies  MEDICATION LIST PRIOR TO VISIT: Current Meds  Medication Sig  . ASPIRIN LOW DOSE 81 MG EC tablet Take 81 mg by mouth daily.  Marland Kitchen atenolol (TENORMIN) 25 MG tablet Take 25 mg by mouth daily.  . Cholecalciferol (VITAMIN D3) 25 MCG (1000 UT) CAPS Take by mouth. Take 1 tablet by mouth three times weekly.  . Ferrous Sulfate (SLOW FE PO)   . hydrOXYzine (VISTARIL) 50 MG capsule Take 50 mg by mouth at bedtime as needed.  . methimazole (TAPAZOLE) 5 MG tablet Take 0.5 tablets (2.5 mg total) by mouth daily.  . Omega-3 Fatty Acids (FISH OIL) 1000 MG CAPS Take by mouth.  . riboflavin (VITAMIN B-2) 100 MG TABS tablet Take 1 tablet by mouth daily.  . rosuvastatin (CRESTOR) 5 MG tablet Take 5 mg by mouth daily.  . Selenium 200 MCG CAPS Take 1 tablet by mouth 2 (two) times daily.     PAST MEDICAL HISTORY: Past Medical History:  Diagnosis Date  . Carotid artery stenosis, asymptomatic, bilateral   . Graves disease     PAST SURGICAL HISTORY: History reviewed. No pertinent surgical history.  FAMILY HISTORY: The patient family history includes Alzheimer's disease in her father; Heart disease in her mother; Stroke in her mother.  SOCIAL HISTORY:  The patient  reports that she has never smoked. She has never used smokeless tobacco. She reports that she does  not drink alcohol and does not use drugs.  REVIEW OF SYSTEMS: Review of Systems  Constitutional: Negative for chills and fever.  HENT: Negative for hoarse voice and nosebleeds.   Eyes: Negative for discharge, double vision and pain.  Cardiovascular: Positive for palpitations. Negative for chest pain, claudication, dyspnea on exertion, leg swelling, near-syncope, orthopnea, paroxysmal  nocturnal dyspnea and syncope.  Respiratory: Negative for hemoptysis and shortness of breath.   Musculoskeletal: Negative for muscle cramps and myalgias.  Gastrointestinal: Negative for abdominal pain, constipation, diarrhea, hematemesis, hematochezia, melena, nausea and vomiting.  Neurological: Negative for dizziness and light-headedness.   PHYSICAL EXAM: Vitals with BMI 07/03/2020 05/27/2020 05/27/2020  Height 5\' 4"  - 5\' 4"   Weight 137 lbs - 134 lbs  BMI 23.5 - 22.99  Systolic 162 153 161  Diastolic 85 77 85  Pulse 81 77 73    CONSTITUTIONAL: Well-developed and well-nourished. No acute distress.  SKIN: Skin is warm and dry. No rash noted. No cyanosis. No pallor. No jaundice HEAD: Normocephalic and atraumatic.  EYES: No scleral icterus MOUTH/THROAT: Moist oral membranes.  NECK: No JVD present. No thyromegaly noted.  Bilateral carotid bruits  LYMPHATIC: No visible cervical adenopathy.  CHEST Normal respiratory effort. No intercostal retractions  LUNGS: Clear to auscultation bilaterally.  No stridor. No wheezes. No rales.  CARDIOVASCULAR: Regular rate and rhythm, positive S1-S2, no murmurs rubs or gallops appreciated. ABDOMINAL: No apparent ascites.  EXTREMITIES: No peripheral edema.  2+ bilateral carotid pulses, radial pulses, femoral pulses, popliteal pulses, and dorsalis pedis and posterior tibial. HEMATOLOGIC: No significant bruising NEUROLOGIC: Oriented to person, place, and time. Nonfocal. Normal muscle tone.  PSYCHIATRIC: Normal mood and affect. Normal behavior. Cooperative  CARDIAC DATABASE: EKG: 04/28/2020: Sinus  Rhythm, 76bpm, normal axis, without underlying injury pattern.  Echocardiogram: 04/28/2020:  Left ventricle cavity is normal in size. Mild concentric hypertrophy of the left ventricle. Normal global wall motion. Normal LV systolic function with EF 61%. Normal diastolic filling pattern.  Structurally normal trileaflet aortic valve. No evidence of aortic stenosis.  Moderate (Grade II) aortic regurgitation.  Structurally normal mitral valve. No evidence of mitral stenosis. Moderate (Grade II) mitral regurgitation.  No evidence of pulmonary hypertension.  Stress Testing: 06/30/2020: Exercise treadmill stress test performed using Bruce protocol.  Patient reached 9.3 METS, and 104% of age predicted maximum heart rate.   Exercise capacity was good.  No chest pain reported.   Normal heart rate response.  Resting hypertension with hypertensive stress response, peak BP 206/74 mmHg. Peak ECG demonstrated sinus tachycardia, 1.5-2 mm upsloping ST depression in inferolateral leads that return to normal within 1 into recovery. These changes are equivocal for ischemia.  Intermediate risk study.  Heart Catheterization: None  14 day mobile cardiac ambulatory telemetry:  Dominant rhythm normal sinus rhythm. Heart rate 59-159 bpm.  Avg HR 88 bpm. No atrial fibrillation, supraventricular tachycardia, high grade AV block, pauses (3 seconds or longer). One episode of nonsustained ventricular tachycardia, 5 beats in duration, average heart rate of 150 bpm, for 2.2 seconds at 5:11 AM on 05/08/2020. Total ventricular ectopic burden <1%. Total supraventricular ectopic burden <1%. Patient triggered events: 0.  Carotid artery duplex 05/08/2020:  Stenosis in the right internal carotid artery (16-49%).  Stenosis in the left internal carotid artery (50-69%).  There is homogenous plaque in bilateral carotid arteries.  Antegrade right vertebral artery flow. Antegrade left vertebral artery flow.  Follow up in six months is appropriate if clinically indicated.   LABORATORY DATA: CBC Latest Ref Rng & Units  01/27/2019 01/26/2019  WBC 4.0 - 10.5 K/uL 7.5 8.6  Hemoglobin 12.0 - 15.0 g/dL 10.9(L) 11.1(L)  Hematocrit 36.0 - 46.0 % 35.2(L) 36.4  Platelets 150 - 400 K/uL 264 257    CMP Latest Ref Rng & Units 01/27/2019 01/26/2019  Glucose 70 - 99 mg/dL 88 79  BUN 6 - 20 mg/dL 11  12  Creatinine 0.10 - 1.00 mg/dL 2.72 5.36  Sodium 644 - 145 mmol/L 140 137  Potassium 3.5 - 5.1 mmol/L 3.3(L) 3.7  Chloride 98 - 111 mmol/L 107 102  CO2 22 - 32 mmol/L 22 21(L)  Calcium 8.9 - 10.3 mg/dL 9.6 9.5    Lipid Panel  No results found for: CHOL, TRIG, HDL, CHOLHDL, VLDL, LDLCALC, LDLDIRECT, LABVLDL  No components found for: NTPROBNP No results for input(s): PROBNP in the last 8760 hours. Recent Labs    02/12/20 1209 03/26/20 1518 06/30/20 1323  TSH <0.01* <0.01* 0.46    BMP No results for input(s): NA, K, CL, CO2, GLUCOSE, BUN, CREATININE, CALCIUM, GFRNONAA, GFRAA in the last 8760 hours.  HEMOGLOBIN A1C Lab Results  Component Value Date   HGBA1C 11.0 02/08/2018   External Labs: Collected: 03/20/2020 at [facility] Creatinine 0.66 mg/dL. eGFR: >60 mL/min per 1.73 m Lipid profile: Total cholesterol 165, triglycerides 60, HDL 53, LDL 100 Hemoglobin 10.8, hematocrit 33.9 TSH: <0.01   IMPRESSION:    ICD-10-CM   1. Asymptomatic bilateral carotid artery stenosis  I65.23   2. NSVT (nonsustained ventricular tachycardia) (HCC)  I47.2   3. Graves disease  E05.00   4. Moderate aortic regurgitation  I35.1   5. Moderate mitral regurgitation  I34.0      RECOMMENDATIONS: Sandra Murray is a 60 y.o. female whose past medical history and cardiac risk factors include:  Graves disease, bilateral asymptomatic carotid artery stenosis.  Asymptomatic bilateral carotid artery stenosis:  Found to have carotid bruits incidentally on physical examination.  Follow-up carotid duplex notes bilateral carotid artery stenosis.  At the last office visit recommended aspirin and statin therapy.  However, patient wanted to discuss this further with PCP.  After follow-up appointment with PCP patient was started on aspirin 81 mg p.o. daily and Crestor 5 mg p.o. nightly.  Agree with medical management and appreciate PCPs efforts.  Repeat carotid duplex  scheduled for August 2022.  Educated on being more cognizant for any focal neurological deficits or slurred speech or other strokelike symptoms and if present to seek medical attention by going to the closest ER via EMS.  Precordial pain: Resolved.  Given her findings of NSVT and noncardiac chest pain and multiple cardiovascular risk factors patient did undergo a GXT since last office visit.  Results reviewed with her in great detail.  No additional recommendations or testing needed at this time.  Palpitations: Improving.  Patient states that her frequency of palpitations have improved significantly since last office encounter.  Currently on atenolol.  Underlying cause is suspected to be anxiety which she is seeing psychiatry for on a weekly basis.  We will continue to monitor for now.  FINAL MEDICATION LIST END OF ENCOUNTER: No orders of the defined types were placed in this encounter.   There are no discontinued medications.   Current Outpatient Medications:  .  ASPIRIN LOW DOSE 81 MG EC tablet, Take 81 mg by mouth daily., Disp: , Rfl:  .  atenolol (TENORMIN) 25 MG tablet, Take 25 mg by mouth daily., Disp: , Rfl:  .  Cholecalciferol (VITAMIN D3) 25  MCG (1000 UT) CAPS, Take by mouth. Take 1 tablet by mouth three times weekly., Disp: , Rfl:  .  Ferrous Sulfate (SLOW FE PO), , Disp: , Rfl:  .  hydrOXYzine (VISTARIL) 50 MG capsule, Take 50 mg by mouth at bedtime as needed., Disp: , Rfl:  .  methimazole (TAPAZOLE) 5 MG tablet, Take 0.5 tablets (2.5 mg total) by mouth daily., Disp: 30 tablet, Rfl: 3 .  Omega-3 Fatty Acids (FISH OIL) 1000 MG CAPS, Take by mouth., Disp: , Rfl:  .  riboflavin (VITAMIN B-2) 100 MG TABS tablet, Take 1 tablet by mouth daily., Disp: , Rfl:  .  rosuvastatin (CRESTOR) 5 MG tablet, Take 5 mg by mouth daily., Disp: , Rfl:  .  Selenium 200 MCG CAPS, Take 1 tablet by mouth 2 (two) times daily., Disp: , Rfl:   No orders of the defined types were placed in this  encounter.   There are no Patient Instructions on file for this visit.  --Continue cardiac medications as reconciled in final medication list. --Return in about 5 months (around 12/05/2020) for Follow up carotid disease. Or sooner if needed. --Continue follow-up with your primary care physician regarding the management of your other chronic comorbid conditions.  Patient's questions and concerns were addressed to her satisfaction. She voices understanding of the instructions provided during this encounter.   This note was created using a voice recognition software as a result there may be grammatical errors inadvertently enclosed that do not reflect the nature of this encounter. Every attempt is made to correct such errors.  Tessa Lerner, [place], [facility]  Pager: [phone] Office: [phone]

## 2020-07-03 ENCOUNTER — Other Ambulatory Visit: Payer: Self-pay

## 2020-07-03 ENCOUNTER — Encounter: Payer: Self-pay | Admitting: Cardiology

## 2020-07-03 ENCOUNTER — Ambulatory Visit: Payer: BC Managed Care – PPO | Admitting: Cardiology

## 2020-07-03 VITALS — BP 162/85 | HR 81 | Temp 98.0°F | Resp 16 | Ht 64.0 in | Wt 137.0 lb

## 2020-07-03 DIAGNOSIS — I6523 Occlusion and stenosis of bilateral carotid arteries: Secondary | ICD-10-CM | POA: Diagnosis not present

## 2020-07-03 DIAGNOSIS — E0500 Thyrotoxicosis with diffuse goiter without thyrotoxic crisis or storm: Secondary | ICD-10-CM

## 2020-07-03 DIAGNOSIS — I472 Ventricular tachycardia: Secondary | ICD-10-CM | POA: Diagnosis not present

## 2020-07-03 DIAGNOSIS — I340 Nonrheumatic mitral (valve) insufficiency: Secondary | ICD-10-CM

## 2020-07-03 DIAGNOSIS — I4729 Other ventricular tachycardia: Secondary | ICD-10-CM

## 2020-07-03 DIAGNOSIS — I351 Nonrheumatic aortic (valve) insufficiency: Secondary | ICD-10-CM | POA: Diagnosis not present

## 2020-07-24 DIAGNOSIS — F4322 Adjustment disorder with anxiety: Secondary | ICD-10-CM | POA: Diagnosis not present

## 2020-08-05 DIAGNOSIS — Z01419 Encounter for gynecological examination (general) (routine) without abnormal findings: Secondary | ICD-10-CM | POA: Diagnosis not present

## 2020-08-06 DIAGNOSIS — E0590 Thyrotoxicosis, unspecified without thyrotoxic crisis or storm: Secondary | ICD-10-CM | POA: Diagnosis not present

## 2020-08-06 DIAGNOSIS — F419 Anxiety disorder, unspecified: Secondary | ICD-10-CM | POA: Diagnosis not present

## 2020-08-06 DIAGNOSIS — R002 Palpitations: Secondary | ICD-10-CM | POA: Diagnosis not present

## 2020-08-06 DIAGNOSIS — I10 Essential (primary) hypertension: Secondary | ICD-10-CM | POA: Diagnosis not present

## 2020-08-07 DIAGNOSIS — F4322 Adjustment disorder with anxiety: Secondary | ICD-10-CM | POA: Diagnosis not present

## 2020-08-13 ENCOUNTER — Other Ambulatory Visit: Payer: Self-pay

## 2020-08-13 ENCOUNTER — Encounter: Payer: Self-pay | Admitting: Internal Medicine

## 2020-08-13 ENCOUNTER — Ambulatory Visit (INDEPENDENT_AMBULATORY_CARE_PROVIDER_SITE_OTHER): Payer: BC Managed Care – PPO | Admitting: Internal Medicine

## 2020-08-13 VITALS — BP 128/78 | HR 62 | Ht 64.0 in | Wt 136.4 lb

## 2020-08-13 DIAGNOSIS — E559 Vitamin D deficiency, unspecified: Secondary | ICD-10-CM | POA: Diagnosis not present

## 2020-08-13 DIAGNOSIS — E0500 Thyrotoxicosis with diffuse goiter without thyrotoxic crisis or storm: Secondary | ICD-10-CM

## 2020-08-13 DIAGNOSIS — H532 Diplopia: Secondary | ICD-10-CM | POA: Diagnosis not present

## 2020-08-13 NOTE — Patient Instructions (Signed)
Please continue methimazole 2.5 mg daily.  Please stop at the lab.  Please come back for a follow-up appointment in 6 months. 

## 2020-08-13 NOTE — Progress Notes (Signed)
Patient ID: Sandra Murray, female   DOB: 03-Sep-1960, 60 y.o.   MRN: [id number]   This visit occurred during the SARS-CoV-2 public health emergency.  Safety protocols were in place, including screening questions prior to the visit, additional usage of staff PPE, and extensive cleaning of exam room while observing appropriate contact time as indicated for disinfecting solutions.   HPI  Sandra Murray is a 60 y.o.-year-old female, initially referred by her gastroenterologist, Dr. Loreta Ave, returning for follow-up for Graves' disease.  Last visit 6 months ago.  Interim history: She had a very high stress job and is retiring this year. Since last visit, she had palpitations and headaches and high heart rates.  She was started back on a beta-blocker by PCP: Atenolol 25 mg daily.  Reviewed and addended history: She started to have some  weight loss and bone pain in 2018 >> presented to see PCP  - TSH was undetectable. She was sent to a rheumatologist for suspicion for RA >> testing was negative.  No intervention was suggested for her abnormal thyroid tests then.  In 10/2017, she started to have: - palpitations - whooshing sound in year - lost weight (also very stressed as she is taking care of her father who has dementia) - tremors - anxiety - insomnia  She presented to see her PCP (Dr. Leavy Cella) >> TSH was again undetectable.  Again, no intervention was suggested.  She then saw Dr. Loreta Ave for a screening colonoscopy >> Dr. Loreta Ave referred her to me urgently for her thyrotoxicosis.  We initially started: -Methimazole 10 mg 2x a day -Atenolol 25 mg daily She did not return for repeat TFTs 1 month after starting the medication (miscommunication?).  However, when she returned to the clinic for another she was telling me that she felt much better.  She gained weight, did not have palpitations or tremors.  At that time, her TSH was very high, at 46.86.  We decrease the methimazole to 5 mg daily and  ended up stopping it completely in 06/2018.  At that time, we also started to taper down atenolol and then stop completely.  Subsequent TFTs were normal in 07/2018, however, she called [place] that she started to have hyperthyroid symptoms again and we restarted a low-dose methimazole, 5 mg daily.  However, in 09/2018, her TSH was again high, at 15.86 I will stop methimazole.  She did not return for repeat labs, and presented to the emergency room in 01/2019 with tachycardia, palpitations, and the TSH was found to be undetectable.  We restarted methimazole, at 5 mg twice a day.  She called [place] in 08/2018 for diplopia and I directed her to her ophthalmologist.  She actually saw a optometrist who noticed an abnormality in her gaze and told her that this is possibly related to the thyroid condition, but did not suggest any other follow-up or treatment.  I referred her to Dr. Toni Arthurs and she did establish care with her.  She initialy had diplopia especially when looking up, then much better on diclofenac - now stopped.  02/2019: Reduced methimazole dose to 5 mg daily  03/2019: We stopped methimazole as TSH was elevated.  07/2019: We restarted methimazole 5 mg daily  11/2019: Stopped methimazole as TSH was again elevated.  01/2020: Restarted MMI 2.5 mg daily  03/2020: Developed headaches and heart rates in the 120s to 130s  04/2020: Started atenolol 25 mg prn by PCP  I reviewed her TFTs: Lab Results  Component Value Date   TSH  0.46 06/30/2020   TSH <0.01 (L) 03/26/2020   TSH <0.01 (L) 02/12/2020   TSH 11.12 (H) 12/19/2019   TSH 3.63 11/16/2019   TSH 0.01 (L) 09/27/2019   TSH <0.01 (L) 08/15/2019   TSH 7.54 (H) 04/20/2019   TSH 2.65 02/27/2019   TSH <0.010 (L) 01/27/2019   FREET4 0.64 06/30/2020   FREET4 0.70 03/26/2020   FREET4 1.15 02/12/2020   FREET4 0.56 (L) 12/19/2019   FREET4 0.64 11/16/2019   FREET4 0.67 09/27/2019   FREET4 1.32 08/15/2019   FREET4 0.48 (L) 04/20/2019   FREET4 0.40  (L) 02/27/2019   FREET4 1.62 (H) 01/27/2019   T3FREE 3.0 06/30/2020   T3FREE 3.1 03/26/2020   T3FREE 4.3 (H) 02/12/2020   T3FREE 3.2 12/19/2019   T3FREE 3.1 11/16/2019   T3FREE 3.2 09/27/2019   T3FREE 4.6 (H) 08/15/2019   T3FREE 2.9 04/20/2019   T3FREE 3.3 02/27/2019   T3FREE 2.9 10/09/2018  04/05/2020: TSH <0.01 02/08/2018: TSH <0.010 12/15/2016: TSH <0.010 12/18/2015: TSH 2.192  TSI's are elevated: Lab Results  Component Value Date   TSI 450 (H) 08/15/2019   TSI 389 (H) 10/09/2018   TSI 504 (H) 02/24/2018   Pt denies: - feeling nodules in neck - hoarseness - dysphagia - choking - SOB with lying down  No FH of thyroid disease or cancer. No h/o radiation tx to head or neck.  No seaweed or kelp. No recent contrast studies. No herbal supplements. No Biotin use. No recent steroids use.   Vitamin deficiency   Reviewed vitamin D levels: Lab Results  Component Value Date   VD25OH 52.1 08/15/2019   VD25OH 36.48 02/27/2019   VD25OH 28.27 (L) 10/09/2018  - level of 14 on 02/08/2018  - level of 12 on 12/18/2015  She is on 2000 units vitamin D daily.    She also has a history of anemia.  ROS: Constitutional: no weight gain or loss, no fatigue, no subjective hyperthermia, no subjective hypothermia Eyes: no blurry vision, no xerophthalmia ENT: no sore throat, + see HPI Cardiovascular: no CP/no SOB/+ palpitations - when stressed/no leg swelling Respiratory: no cough/no SOB/no wheezing Gastrointestinal: no N/no V/no D/no C/no acid reflux Musculoskeletal: no muscle aches/no joint aches Skin: no rashes, no hair loss Neurological: no Tremors/no numbness/no tingling/no dizziness  I reviewed pt's medications, allergies, PMH, social hx, family hx, and changes were documented in the history of present illness. Otherwise, unchanged from my initial visit note.  PMH: -See above  No past surgical history  Social History   Socioeconomic History  . Marital status: Married     Spouse name: Not on file  . Number of children: 0  . Years of education: Not on file  . 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 Smoking status: Never Smoker  . Smokeless tobacco: Never Used  Substance and Sexual Activity  . Alcohol use: No  . Drug use: No   Current Outpatient Medications  Medication Sig Dispense Refill  . ASPIRIN LOW DOSE 81 MG EC tablet Take 81 mg by mouth daily.    Marland Kitchen atenolol (TENORMIN) 25 MG tablet Take 25 mg by mouth daily.    . Cholecalciferol (VITAMIN D3) 25 MCG (1000 UT) CAPS Take by mouth. Take 1 tablet by mouth three times weekly.    . Ferrous Sulfate (SLOW FE PO)     . hydrOXYzine (VISTARIL) 50 MG capsule Take 50 mg by mouth at bedtime  as needed.    . methimazole (TAPAZOLE) 5 MG tablet Take 0.5 tablets (2.5 mg total) by mouth daily. 30 tablet 3  . Omega-3 Fatty Acids (FISH OIL) 1000 MG CAPS Take by mouth.    . riboflavin (VITAMIN B-2) 100 MG TABS tablet Take 1 tablet by mouth daily.    . rosuvastatin (CRESTOR) 5 MG tablet Take 5 mg by mouth daily.    . Selenium 200 MCG CAPS Take 1 tablet by mouth 2 (two) times daily.     No current facility-administered medications for this visit.    No Known Allergies   Family History  Problem Relation Age of Onset  . Stroke Mother   . Heart disease Mother   . Alzheimer's disease Father     Mother  - heart ds Father  - Prostate cancer  PE: BP 128/78 (BP Location: Right Arm, Patient Position: Sitting, Cuff Size: Normal)   Pulse 62   Ht 5\' 4"  (1.626 m)   Wt 136 lb 6.4 oz (61.9 kg)   SpO2 99%   BMI 23.41 kg/m  Wt Readings from Last 3 Encounters:  08/13/20 136 lb 6.4 oz (61.9 kg)  07/03/20 137 lb (62.1 kg)  05/27/20 134 lb (60.8 kg)   Constitutional: normal weight, in NAD Eyes: PERRLA, EOMI, + exophthalmos, + stare ENT: moist mucous membranes, no thyromegaly, no cervical lymphadenopathy Cardiovascular: RRR, No RG, +1/6 SEM Respiratory:  CTA B Gastrointestinal: abdomen soft, NT, ND, BS+ Musculoskeletal: no deformities, strength intact in all 4 Skin: moist, warm, no rashes Neurological: no tremor with outstretched hands, DTR normal in all 4  ASSESSMENT: 1. Graves ds.  2.  Diplopia  3. Vit D deficiency  PLAN:  1. Patient with history of low TSH, confirmed this Graves' disease, based on high TSI antibodies. She initially had thyrotoxic symptoms: Weight loss, palpitations, tremors, anxiety, which resolved after starting methimazole.  We discussed in the past and again today about possible consequences of uncontrolled thyrotoxicosis to include arrhythmia, heart failure, hypercoagulability with increased risk of stroke, PE and MI, sudden death.  She finally agreed to stop methimazole and atenolol, however, she did not return for labs in the next TSH returned very high at 47.  At that time, we decreased her methimazole dose to 5 mg daily and ended up stopping the methimazole completely in 06/2018.  Her TFTs normalized in 07/2018, however, she had return of thyrotoxicosis symptoms and we had to restart low-dose methimazole 5 mg daily.   In 09/2018, her TSH was again high, at 15.86 so we again stoppred methimazole.  However, she presented to the emergency room with tachycardia and palpitations at the beginning of 01/2019.  We restarted methimazole at that time.  Afterwards, we were again able to stop methimazole 11/2019 due to elevated TSH. -At last visit, however, TSH was undetectably low and free T3 was slightly high.  We restarted methimazole 2.5 mg daily 01/2020. -Due to the variability in her blood sugars, I again suggested definitive treatment for her Graves' disease, but she refused.  However, we have to be careful if we proceed with RAI treatment due to the fact that she does have mild exophthalmos.  I referred her to [facility] Ralene Muskrat at [facility].  She started diclofenac and this was helping.  She is now off.  Solu-Medrol and  Alveda Reasons were considered but she did not have to start these yet.  In the past, she had diplopia with upward gaze, but this resolved.  In the past  we also discussed about thyroidectomy with cervical and transaxillary approaches, but she wants to avoid this. As of now, she opted to stay on MMI and work on diet and stress-reduction. -She was able to come off atenolol in the past but she restarted it due to high heart rates and palpitations -Latest TSH was normal 06/30/2020, and 0.46 -At today's visit, we will recheck her TFTs and change the methimazole dose accordingly - if TSH increased, we will need to reduce the MMI dose more slowly -I will see her back in 6 months but likely sooner for labs  2.  Vit D def -Continues on 2000 units vitamin D daily, previously missing doses -Latest vitamin D level was normal, at 52 in 07/2019, improved: Lab Results  Component Value Date   VD25OH 52.1 08/15/2019   VD25OH 36.48 02/27/2019   VD25OH 28.27 (L) 10/09/2018  -We will repeat the level at this visit  Component     Latest Ref Rng & Units 08/13/2020  TSH     0.35 - 4.50 uIU/mL 0.93  T4,Free(Direct)     0.60 - 1.60 ng/dL 1.61  Triiodothyronine,Free,Serum     2.3 - 4.2 pg/mL 2.9  Vitamin D, 25-Hydroxy     30.0 - 100.0 ng/mL 57.7  TFTs are normal.  We will continue the same dose of methimazole, 2.5 mg daily. Vitamin D level is also normal.  Carlus Pavlov, MD PhD [facility]

## 2020-08-14 LAB — TSH: TSH: 0.93 u[IU]/mL (ref 0.35–4.50)

## 2020-08-14 LAB — T4, FREE: Free T4: 0.7 ng/dL (ref 0.60–1.60)

## 2020-08-14 LAB — T3, FREE: T3, Free: 2.9 pg/mL (ref 2.3–4.2)

## 2020-08-14 LAB — VITAMIN D 25 HYDROXY (VIT D DEFICIENCY, FRACTURES): Vit D, 25-Hydroxy: 57.7 ng/mL (ref 30.0–100.0)

## 2020-08-21 DIAGNOSIS — I10 Essential (primary) hypertension: Secondary | ICD-10-CM | POA: Diagnosis not present

## 2020-08-21 DIAGNOSIS — Z733 Stress, not elsewhere classified: Secondary | ICD-10-CM | POA: Diagnosis not present

## 2020-08-21 DIAGNOSIS — R52 Pain, unspecified: Secondary | ICD-10-CM | POA: Diagnosis not present

## 2020-08-21 DIAGNOSIS — F4322 Adjustment disorder with anxiety: Secondary | ICD-10-CM | POA: Diagnosis not present

## 2020-09-02 DIAGNOSIS — Z1211 Encounter for screening for malignant neoplasm of colon: Secondary | ICD-10-CM | POA: Diagnosis not present

## 2020-09-02 DIAGNOSIS — D509 Iron deficiency anemia, unspecified: Secondary | ICD-10-CM | POA: Diagnosis not present

## 2020-09-15 DIAGNOSIS — H04123 Dry eye syndrome of bilateral lacrimal glands: Secondary | ICD-10-CM | POA: Diagnosis not present

## 2020-09-15 DIAGNOSIS — H2513 Age-related nuclear cataract, bilateral: Secondary | ICD-10-CM | POA: Diagnosis not present

## 2020-09-15 DIAGNOSIS — E063 Autoimmune thyroiditis: Secondary | ICD-10-CM | POA: Diagnosis not present

## 2020-10-15 ENCOUNTER — Encounter: Payer: Self-pay | Admitting: Physician Assistant

## 2020-11-03 ENCOUNTER — Other Ambulatory Visit: Payer: BC Managed Care – PPO

## 2020-11-13 ENCOUNTER — Encounter: Payer: Self-pay | Admitting: Physician Assistant

## 2020-11-13 ENCOUNTER — Ambulatory Visit (INDEPENDENT_AMBULATORY_CARE_PROVIDER_SITE_OTHER): Payer: BC Managed Care – PPO | Admitting: Physician Assistant

## 2020-11-13 VITALS — BP 150/80 | HR 76 | Ht 64.0 in | Wt 135.4 lb

## 2020-11-13 DIAGNOSIS — K5900 Constipation, unspecified: Secondary | ICD-10-CM | POA: Diagnosis not present

## 2020-11-13 DIAGNOSIS — R002 Palpitations: Secondary | ICD-10-CM

## 2020-11-13 DIAGNOSIS — Z1211 Encounter for screening for malignant neoplasm of colon: Secondary | ICD-10-CM | POA: Diagnosis not present

## 2020-11-13 DIAGNOSIS — D509 Iron deficiency anemia, unspecified: Secondary | ICD-10-CM

## 2020-11-13 MED ORDER — NA SULFATE-K SULFATE-MG SULF 17.5-3.13-1.6 GM/177ML PO SOLN: 1.0000 | Freq: Once | ORAL | 0 refills | Status: AC

## 2020-11-13 NOTE — Patient Instructions (Addendum)
Start fiber supplement such as Fiber-one bar or Benefiber daily.   Please call back if you decide to schedule the upper endoscopy.   You have been scheduled for a colonoscopy. Please follow written instructions given to you at your visit today.  Please pick up your prep supplies at the pharmacy within the next 1-3 days. If you use inhalers (even only as needed), please bring them with you on the day of your procedure.  If you are age 60 or older, your body mass index should be between 23-30. Your Body mass index is 23.24 kg/m. If this is out of the aforementioned range listed, please consider follow up with your Primary Care Provider.  If you are age 67 or younger, your body mass index should be between 19-25. Your Body mass index is 23.24 kg/m. If this is out of the aformentioned range listed, please consider follow up with your Primary Care Provider.   __________________________________________________________  The [facility] providers would like to encourage you to use [facility] to communicate with providers for non-urgent requests or questions.  Due to long hold times on the telephone, sending your provider a message by [facility] may be a faster and more efficient way to get a response.  Please allow 48 business hours for a response.  Please remember that this is for non-urgent requests.

## 2020-11-13 NOTE — Progress Notes (Addendum)
Chief Complaint: Consult for colonoscopy  HPI:    [name] is a 60 year old African-American female with a past medical history of Graves' disease and others listed below, who was referred to me by Audley Hose, MD for discussion of a screening colonoscopy.    01/26/2021 CBC with hemoglobin of 10.9.    03/20/2020 CBC with a hemoglobin low at 10.8.  Ferritin and iron profile normal at that time.    07/03/2020 patient seen by [facility] cardiovascular for follow-up of palpitations which had improved.  Thought possibly related to anxiety after work-up.    08/13/2020 patient saw Dr. Benjiman Core her endocrinologist to follow her Berenice Primas' disease.  At that time stable.    Today, the patient presents to clinic and tells me that she would like to talk about getting a colonoscopy.  Tells me that her Graves' disease is under control and her palpitations are improved and were thought related to anxiety by her psychiatrist.  Also discusses her iron deficiency anemia which she was diagnosed with years ago when she was having heavy menstrual periods.  She no longer takes her oral iron supplementation.  Tells me she never really had a work-up for this.    Does discuss what she thinks is constipation today.  Tells me that she will go to the bathroom every 3 days or so and may have 1-2 bowel movements that day.  This is always been her normal.  In between she does get slightly bloated but never uncomfortable.  Tells me that she has always heard you are supposed to go "multiple times a day".    Denies fever, chills, weight loss or blood in her stool.  Past Medical History:  Diagnosis Date   Carotid artery stenosis, asymptomatic, bilateral    Graves disease     History reviewed. No pertinent surgical history.  Current Outpatient Medications  Medication Sig Dispense Refill   calcium-vitamin D (OSCAL WITH D) 500-200 MG-UNIT tablet Take 1 mcg by mouth daily.     Cholecalciferol (VITAMIN D3) 25  MCG (1000 UT) CAPS Take by mouth. Take 1 tablet by mouth three times weekly.     Cyanocobalamin (VITAMIN B 12 PO) Take 1 mg by mouth daily.     Ferrous Sulfate (SLOW FE PO)      methimazole (TAPAZOLE) 5 MG tablet Take 0.5 tablets (2.5 mg total) by mouth daily. 30 tablet 3   Na Sulfate-K Sulfate-Mg Sulf 17.5-3.13-1.6 GM/177ML SOLN Take 1 kit by mouth once for 1 dose. 354 mL 0   Omega-3 Fatty Acids (FISH OIL) 1000 MG CAPS Take by mouth.     riboflavin (VITAMIN B-2) 100 MG TABS tablet Take 1 tablet by mouth daily.     Selenium 200 MCG CAPS Take 1 tablet by mouth 2 (two) times daily.     No current facility-administered medications for this visit.    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   Tobacco Use   Smoking  status: Never   Smokeless tobacco: Never  Vaping Use   Vaping Use: Never used  Substance and Sexual Activity   Alcohol use: Never   Drug use: Never   Sexual activity: Not on file  Other Topics Concern   Not on file  Social History Narrative   Right handed    Lives at home with spouse    No caffeine daily    Social Determinants of Health   Financial Resource Strain: Not on file  Food Insecurity: Not on file  Transportation Needs: Not on file  Physical Activity: Not on file  Stress: Not on file  Social Connections: Not on file  Intimate Partner Violence: Not on file    Review of Systems:    Constitutional: No weight loss, fever or chills Skin: No rash Cardiovascular: No chest pain  Respiratory: No SOB  Gastrointestinal:  See HPI and otherwise negative Genitourinary: No dysuria  Neurological: No headache, dizziness or syncope Musculoskeletal: No new muscle or joint pain Hematologic: No bleeding  Psychiatric: No history of depression or anxiety   Physical Exam:  Vital signs: BP (!) 150/80   Pulse 76   Ht 5' 4"  (1.626 m)   Wt 135 lb 6 oz (61.4 kg)   BMI 23.24 kg/m   Constitutional:   Pleasant AA female appears to be in NAD, Well developed, Well nourished, alert and cooperative Head:  Normocephalic and atraumatic. Eyes:   PEERL, EOMI. No icterus. Conjunctiva pink. Ears:  Normal auditory acuity. Neck:  Supple Throat: Oral cavity and pharynx without inflammation, swelling or lesion.  Respiratory: Respirations even and unlabored. Lungs clear to auscultation bilaterally.   No wheezes, crackles, or rhonchi.  Cardiovascular: Normal S1, S2. No MRG. Regular rate and rhythm. No peripheral edema, cyanosis or pallor.  Gastrointestinal:  Soft, nondistended, nontender. No rebound or guarding. Normal bowel sounds. No appreciable masses or hepatomegaly. Rectal:  Not performed.  Msk:  Symmetrical without gross deformities. Without edema, no deformity or joint abnormality.  Neurologic:  Alert and  oriented x4;  grossly normal neurologically.  Skin:   Dry and intact without significant lesions or rashes. Psychiatric:  Demonstrates good judgement and reason without abnormal affect or behaviors.  See HPI for recent labs.  Assessment: 1.  Screening for colorectal cancer: Patient has never had a screening colonoscopy 2.  Iron deficiency anemia: Longstanding for the patient, was initially thought related to heavy menstrual periods, no GI work-up 3.  Palpitations: Work-up with cardiology recently, thought possibly related to anxiety and under better control now  Plan: 1.  Discussed with patient that if she has longstanding iron deficiency anemia which has never been worked up she may benefit from an EGD along with her  colonoscopy.  We discussed this further in detail and she explained "I do not think I want one of those".  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  Explained that it would not. 2.  After discussion patient would like to be scheduled for colonoscopy in November.  She requests a female physician and was scheduled with Dr. Tarri Glenn.  Did provide the patient a detailed list of risks for this procedure and she agrees to proceed.  (We did discuss that she can research an EGD a little further and if she would like this added on she can call us back, explained that if it is close to time for colonoscopy we may need to change the date given that  this procedure adds time) 3.  Discussed patient's bowel habits, these are really fairly quite normal, normal can be 3 times a day or once every 3 days as long as she is not uncomfortable and it has not been a change for her.  Did discuss the addition of a fiber supplement such as Benefiber and a fiber 1 bar which may be helpful in addition to 6-8 eight ounce glasses of water per day and increased exercise. 4.  We will request recent labs from her PCP 5.  Did request cardiac clearance given patient's recent work-up for palpitations. 6.  Patient to follow in clinic per recommendations from Dr. Tarri Glenn after time of colonoscopy.  Ellouise Newer, PA-C [facility] 11/13/2020, 10:49 AM  Cc: Audley Hose, MD   Addendum: 11:10 AM 11/25/2020  Received labs.  06/19/2020 iron testing normal.  CMP normal.  CBC with a hemoglobin low at 10.5, MCV low at 74.4.  Otherwise normal.  Again recommendations would be for an EGD with patient's colonoscopy due to anemia, which she declined at time of office visit.  Ellouise Newer, PA-C.

## 2020-11-14 NOTE — Progress Notes (Signed)
Reviewed and agree with management plans. ? ?Jem Castro L. Electra Paladino, MD, MPH  ?

## 2020-11-25 ENCOUNTER — Other Ambulatory Visit: Payer: Self-pay | Admitting: Internal Medicine

## 2020-12-02 ENCOUNTER — Ambulatory Visit: Payer: BC Managed Care – PPO | Admitting: Cardiology

## 2020-12-26 DIAGNOSIS — I6529 Occlusion and stenosis of unspecified carotid artery: Secondary | ICD-10-CM | POA: Diagnosis not present

## 2020-12-26 DIAGNOSIS — E785 Hyperlipidemia, unspecified: Secondary | ICD-10-CM | POA: Diagnosis not present

## 2020-12-26 DIAGNOSIS — R002 Palpitations: Secondary | ICD-10-CM | POA: Diagnosis not present

## 2020-12-26 DIAGNOSIS — D509 Iron deficiency anemia, unspecified: Secondary | ICD-10-CM | POA: Diagnosis not present

## 2021-01-08 ENCOUNTER — Other Ambulatory Visit: Payer: BC Managed Care – PPO

## 2021-01-28 ENCOUNTER — Encounter: Payer: BC Managed Care – PPO | Admitting: Gastroenterology

## 2021-02-18 ENCOUNTER — Other Ambulatory Visit: Payer: Self-pay

## 2021-02-18 ENCOUNTER — Other Ambulatory Visit: Payer: BC Managed Care – PPO

## 2021-02-18 ENCOUNTER — Ambulatory Visit (INDEPENDENT_AMBULATORY_CARE_PROVIDER_SITE_OTHER): Payer: BC Managed Care – PPO | Admitting: Internal Medicine

## 2021-02-18 VITALS — BP 138/90 | HR 88 | Ht 64.0 in | Wt 140.4 lb

## 2021-02-18 DIAGNOSIS — E559 Vitamin D deficiency, unspecified: Secondary | ICD-10-CM | POA: Diagnosis not present

## 2021-02-18 DIAGNOSIS — Z13220 Encounter for screening for lipoid disorders: Secondary | ICD-10-CM | POA: Diagnosis not present

## 2021-02-18 DIAGNOSIS — E0500 Thyrotoxicosis with diffuse goiter without thyrotoxic crisis or storm: Secondary | ICD-10-CM

## 2021-02-18 DIAGNOSIS — Z0000 Encounter for general adult medical examination without abnormal findings: Secondary | ICD-10-CM | POA: Diagnosis not present

## 2021-02-18 DIAGNOSIS — I10 Essential (primary) hypertension: Secondary | ICD-10-CM | POA: Diagnosis not present

## 2021-02-18 DIAGNOSIS — Z8249 Family history of ischemic heart disease and other diseases of the circulatory system: Secondary | ICD-10-CM | POA: Diagnosis not present

## 2021-02-18 DIAGNOSIS — Z1389 Encounter for screening for other disorder: Secondary | ICD-10-CM | POA: Diagnosis not present

## 2021-02-18 LAB — T4, FREE: Free T4: 0.76 ng/dL (ref 0.60–1.60)

## 2021-02-18 LAB — TSH: TSH: 1.94 u[IU]/mL (ref 0.35–5.50)

## 2021-02-18 LAB — T3, FREE: T3, Free: 3.6 pg/mL (ref 2.3–4.2)

## 2021-02-18 MED ORDER — ATENOLOL 25 MG PO TABS: 25.0000 mg | ORAL_TABLET | Freq: Every day | ORAL | 3 refills | Status: DC

## 2021-02-18 NOTE — Patient Instructions (Addendum)
Please continue methimazole 2.5 mg daily.  Please restart Atenolol 25 mg as needed for heart rate >90 at rest. You may not need to take this every day.  Also, continue vitamin D 2000 units daily.  Please stop at the lab.  Please come back for a follow-up appointment in 6 months.

## 2021-02-18 NOTE — Progress Notes (Signed)
Patient ID: Aijalon Greenhill, female   DOB: 03-21-1961, 60 y.o.   MRN: [id number]   This visit occurred during the SARS-CoV-2 public health emergency.  Safety protocols were in place, including screening questions prior to the visit, additional usage of staff PPE, and extensive cleaning of exam room while observing appropriate contact time as indicated for disinfecting solutions.   HPI  Pashence Sebold is a 60 y.o.-year-old female, initially referred by her gastroenterologist, Dr. Loreta Ave, returning for follow-up for Graves' disease.  Last visit 6 months ago.  Interim history: At last visit she mentioned a very high stress job and was planning to retire.  At that time, she had palpitations and headaches and was started back on atenolol 25 mg daily by PCP.  She ran out of this since last visit.  Symptoms resolved but her smart watch is still showing her heart rate in the 90s at rest. She gained 4 pounds since last visit.  She started exercise. No tremors, palpitations, chest pain, heat intolerance, cold intolerance, constipation.  Reviewed and addended history: She started to have some  weight loss and bone pain in 2018 >> presented to see PCP  - TSH was undetectable. She was sent to a rheumatologist for suspicion for RA >> testing was negative.  No intervention was suggested for her abnormal thyroid tests then.  In 10/2017, she started to have: - palpitations - whooshing sound in year - lost weight (also very stressed as she is taking care of her father who has dementia) - tremors - anxiety - insomnia  She presented to see her PCP (Dr. Leavy Cella) >> TSH was again undetectable.  Again, no intervention was suggested.  She then saw Dr. Loreta Ave for a screening colonoscopy >> Dr. Loreta Ave referred her to me urgently for her thyrotoxicosis.  We initially started: -Methimazole 10 mg 2x a day -Atenolol 25 mg daily She did not return for repeat TFTs 1 month after starting the medication  (miscommunication?).  However, when she returned to the clinic for another she was telling me that she felt much better.  She gained weight, did not have palpitations or tremors.  At that time, her TSH was very high, at 46.86.  We decrease the methimazole to 5 mg daily and ended up stopping it completely in 06/2018.  At that time, we also started to taper down atenolol and then stop completely.  Subsequent TFTs were normal in 07/2018, however, she called [place] that she started to have hyperthyroid symptoms again and we restarted a low-dose methimazole, 5 mg daily.  However, in 09/2018, her TSH was again high, at 15.86 I will stop methimazole.  She did not return for repeat labs, and presented to the emergency room in 01/2019 with tachycardia, palpitations, and the TSH was found to be undetectable.  We restarted methimazole, at 5 mg twice a day.  She called [place] in 08/2018 for diplopia and I directed her to her ophthalmologist.  She actually saw a optometrist who noticed an abnormality in her gaze and told her that this is possibly related to the thyroid condition, but did not suggest any other follow-up or treatment.  I referred her to Dr. Toni Arthurs and she did establish care with her.  She initialy had diplopia especially when looking up, then much better on diclofenac - now stopped.  02/2019: Reduced methimazole dose to 5 mg daily  03/2019: We stopped methimazole as TSH was elevated.  07/2019: We restarted methimazole 5 mg daily  11/2019: Stopped methimazole as TSH  was again elevated.  01/2020: Restarted MMI 2.5 mg daily  03/2020: Developed headaches and heart rates in the 120s to 130s  04/2020: Started atenolol 25 mg prn by PCP - ran out  I reviewed her TFTs: Lab Results  Component Value Date   TSH 0.93 08/13/2020   TSH 0.46 06/30/2020   TSH <0.01 (L) 03/26/2020   TSH <0.01 (L) 02/12/2020   TSH 11.12 (H) 12/19/2019   TSH 3.63 11/16/2019   TSH 0.01 (L) 09/27/2019   TSH <0.01 (L) 08/15/2019    TSH 7.54 (H) 04/20/2019   TSH 2.65 02/27/2019   FREET4 0.70 08/13/2020   FREET4 0.64 06/30/2020   FREET4 0.70 03/26/2020   FREET4 1.15 02/12/2020   FREET4 0.56 (L) 12/19/2019   FREET4 0.64 11/16/2019   FREET4 0.67 09/27/2019   FREET4 1.32 08/15/2019   FREET4 0.48 (L) 04/20/2019   FREET4 0.40 (L) 02/27/2019   T3FREE 2.9 08/13/2020   T3FREE 3.0 06/30/2020   T3FREE 3.1 03/26/2020   T3FREE 4.3 (H) 02/12/2020   T3FREE 3.2 12/19/2019   T3FREE 3.1 11/16/2019   T3FREE 3.2 09/27/2019   T3FREE 4.6 (H) 08/15/2019   T3FREE 2.9 04/20/2019   T3FREE 3.3 02/27/2019  04/05/2020: TSH <0.01 02/08/2018: TSH <0.010 12/15/2016: TSH <0.010 12/18/2015: TSH 2.192  TSI's are elevated: Lab Results  Component Value Date   TSI 450 (H) 08/15/2019   TSI 389 (H) 10/09/2018   TSI 504 (H) 02/24/2018   Pt denies: - feeling nodules in neck - hoarseness - dysphagia - choking - SOB with lying down  No FH of thyroid disease or cancer. No h/o radiation tx to head or neck.  No seaweed or kelp. No recent contrast studies. No herbal supplements. No Biotin use. No recent steroids use.   Vitamin deficiency   Reviewed vitamin D levels: Lab Results  Component Value Date   VD25OH 57.7 08/13/2020   VD25OH 52.1 08/15/2019   VD25OH 36.48 02/27/2019   VD25OH 28.27 (L) 10/09/2018  - level of 14 on 02/08/2018  - level of 12 on 12/18/2015  She is on 2000 units vitamin D daily.    She also has a history of anemia.  ROS:+ see HPI Cardiovascular: no CP/no SOB/+ palpitations - when stressed/no leg swelling  I reviewed pt's medications, allergies, PMH, social hx, family hx, and changes were documented in the history of present illness. Otherwise, unchanged from my initial visit note.  PMH: -See above  No past surgical history  Social History   Socioeconomic History   Marital status: Married    Spouse name: Not on file   Number of children: 0   Years of education: Not on file   Highest education level:  Not on file  Occupational History   Director of Human Resources  Tobacco Use   Smoking status: Never Smoker   Smokeless tobacco: Never Used  Substance and Sexual Activity   Alcohol use: No   Drug use: No   Current Outpatient Medications  Medication Sig Dispense Refill   calcium-vitamin D (OSCAL WITH D) 500-200 MG-UNIT tablet Take 1 mcg by mouth daily.     Cholecalciferol (VITAMIN D3) 25 MCG (1000 UT) CAPS Take by mouth. Take 1 tablet by mouth three times weekly.     Cyanocobalamin (VITAMIN B 12 PO) Take 1 mg by mouth daily.     Ferrous Sulfate (SLOW FE PO)      methimazole (TAPAZOLE) 5 MG tablet TAKE 1 TABLET BY MOUTH EVERY DAY 90 tablet 0  Omega-3 Fatty Acids (FISH OIL) 1000 MG CAPS Take by mouth.     riboflavin (VITAMIN B-2) 100 MG TABS tablet Take 1 tablet by mouth daily.     Selenium 200 MCG CAPS Take 1 tablet by mouth 2 (two) times daily.     No current facility-administered medications for this visit.    No Known Allergies   Family History  Problem Relation Age of Onset   Stroke Mother    Heart disease Mother    Alzheimer's disease Father     Mother  - heart ds Father  - Prostate cancer  PE: BP 138/90 (BP Location: Left Arm, Patient Position: Sitting, Cuff Size: Normal)   Pulse 88   Ht 5\' 4"  (1.626 m)   Wt 140 lb 6.4 oz (63.7 kg)   SpO2 99%   BMI 24.10 kg/m  Wt Readings from Last 3 Encounters:  02/18/21 140 lb 6.4 oz (63.7 kg)  11/13/20 135 lb 6 oz (61.4 kg)  08/13/20 136 lb 6.4 oz (61.9 kg)   Constitutional: normal weight, in NAD Eyes: PERRLA, EOMI, + exophthalmos, + stare ENT: moist mucous membranes, no thyromegaly, no cervical lymphadenopathy Cardiovascular: RRR, No RG, +1/6 SEM Respiratory: CTA B Musculoskeletal: no deformities, strength intact in all 4 Skin: moist, warm, no rashes Neurological: no tremor with outstretched hands, DTR normal in all 4  ASSESSMENT: 1. Graves ds.  2.  Diplopia  3. Vit D deficiency  PLAN:  1. Patient with history  of low TSH, confirmed as Graves' disease, based on high TSI antibodies.  She initially had thyrotoxic symptoms including weight loss, palpitations, tremors, anxiety, which resolved after starting methimazole.  We discussed in the past about possible consequences of uncontrolled thyrotoxicosis to include arrhythmia, heart failure, coagulability with the risk of stroke, PE, AMI, sudden death.  We initially started methimazole and atenolol, however, she did not return for labs and the next TSH returned very high, at 47.  At that time, we decreased her methimazole dose and ended up stopping the methimazole completely in 06/2018.  However, afterwards, she had return of thyrotoxicosis symptoms and we had to restart methimazole 5 mg daily.  In 09/2018, her TSH was again high, at 16, so we again stopped methimazole.  She presented to the emergency room with tachycardia and palpitations in 01/2019 and methimazole was restarted at that time.  A subsequent TSH was again high in 11/2019 so we stopped methimazole again.  We restarted methimazole 2.5 mg daily in 01/2020.  At last visit, in 07/2020, TFTs were normal so we continued the same dose of methimazole. -Due to the variability in her TFTs and inability to regulate her thyroid status I strongly suggested definitive treatment for her Luiz Blare' disease in the past in the form of RAI treatment or surgery (we discussed that this could be done with transaxillary approach).  Due to mild exophthalmos, she would probably be a better candidate for surgery, rather than RAI, but RAI could be done with caution.  Unfortunately, she refused these. -For her exophthalmos with diplopia with upward gaze, I referred her to Dr. Ralene Muskrat at [facility].  She started diclofenac and this was helping and was able to come off afterwards.  Solu-Medrol and Alveda Reasons were considered but were not needed. On Selenium.  She has a new appointment with Dr. Toni Arthurs in a week. -She was able to come off  atenolol in the past but she restarted it due to high heart rate and palpitations. At last OV, she  was on 25 mg daily  - ran out.  As of now, she has a smart watch and her pulse can be over 90 at rest.  We discussed about starting atenolol back.  She may not need it every day and I am hoping that she can come off completely in the future.  She is exercising at home and heart rate increase up to 140s with exercise -TSH was normal at last visit so we continued the same dose of MMI -We will recheck her TFTs at this visit and change the methimazole dose accordingly -I would be reticent to stop methimazole completely due to the above history -if we need to decrease the dose, I would advise her to take 2.5 mg every other day. -At today's visit, we we will also check her TSI's -I will see her back in 6 months, but likely sooner for labs  2.  Vit D def -Continues on 2000 units vitamin D daily -No generalized joint pains -Latest vitamin D level was excellent at last visit: Lab Results  Component Value Date   VD25OH 57.7 08/13/2020   VD25OH 52.1 08/15/2019   VD25OH 36.48 02/27/2019   VD25OH 28.27 (L) 10/09/2018  -We will repeat this at next visit  Component     Latest Ref Rng & Units 02/18/2021  TSH     0.35 - 5.50 uIU/mL 1.94  T4,Free(Direct)     0.60 - 1.60 ng/dL 4.09  Triiodothyronine,Free,Serum     2.3 - 4.2 pg/mL 3.6  TSI     <140 % baseline 571 (H)  Thyroid tests are actually normal, while her TSI antibodies are still high.  We will continue with the same dose of methimazole for now.  Carlus Pavlov, MD PhD [facility]

## 2021-02-24 LAB — THYROID STIMULATING IMMUNOGLOBULIN: TSI: 571 % baseline — ABNORMAL HIGH (ref <140)

## 2021-02-25 ENCOUNTER — Other Ambulatory Visit: Payer: Self-pay

## 2021-02-25 ENCOUNTER — Encounter: Payer: Self-pay | Admitting: Internal Medicine

## 2021-02-25 ENCOUNTER — Ambulatory Visit: Payer: BC Managed Care – PPO

## 2021-02-25 DIAGNOSIS — I6523 Occlusion and stenosis of bilateral carotid arteries: Secondary | ICD-10-CM

## 2021-03-04 DIAGNOSIS — E063 Autoimmune thyroiditis: Secondary | ICD-10-CM | POA: Diagnosis not present

## 2021-03-04 DIAGNOSIS — H02534 Eyelid retraction left upper eyelid: Secondary | ICD-10-CM | POA: Diagnosis not present

## 2021-03-04 DIAGNOSIS — H02531 Eyelid retraction right upper eyelid: Secondary | ICD-10-CM | POA: Diagnosis not present

## 2021-03-04 DIAGNOSIS — H47233 Glaucomatous optic atrophy, bilateral: Secondary | ICD-10-CM | POA: Diagnosis not present

## 2021-03-04 DIAGNOSIS — H04123 Dry eye syndrome of bilateral lacrimal glands: Secondary | ICD-10-CM | POA: Diagnosis not present

## 2021-03-04 DIAGNOSIS — H2513 Age-related nuclear cataract, bilateral: Secondary | ICD-10-CM | POA: Diagnosis not present

## 2021-03-08 ENCOUNTER — Other Ambulatory Visit: Payer: Self-pay | Admitting: Cardiology

## 2021-03-08 DIAGNOSIS — I6523 Occlusion and stenosis of bilateral carotid arteries: Secondary | ICD-10-CM

## 2021-03-09 ENCOUNTER — Encounter: Payer: Self-pay | Admitting: Gastroenterology

## 2021-03-09 NOTE — Progress Notes (Signed)
Called and spoke with patient regarding her CAD results. No need to call her back, it has been taken care of.

## 2021-03-09 NOTE — Progress Notes (Signed)
Called patient, Sandra Murray, Sandra Murray

## 2021-03-31 ENCOUNTER — Other Ambulatory Visit: Payer: Self-pay | Admitting: Internal Medicine

## 2021-04-14 ENCOUNTER — Telehealth: Payer: Self-pay

## 2021-04-14 ENCOUNTER — Telehealth: Payer: Self-pay | Admitting: *Deleted

## 2021-04-14 NOTE — Telephone Encounter (Signed)
[facility] Pre-operative Risk Assessment     Request for surgical clearance:     Endoscopy Procedure  What type of surgery is being performed?  Colonoscopy  When is this surgery scheduled?  05-13-2021  What type of clearance is required ? Cardiac Clearance per Dr Tarri Glenn   Are there any medications that need to be held prior to surgery and how long?  No   Practice name and name of physician performing surgery?      [facility]  What is your office phone and fax number?      Phone- [phone]  Fax857 [phone]  Anesthesia type (None, local, MAC, general) ?       MAC

## 2021-04-14 NOTE — Telephone Encounter (Signed)
Called pt to discuss Cardiac Clearance requested from [facility] visit with pt on 11/13/20.  Discussed that with record, last cardiology appt was 06/2020 and that we needed a clearance.  Pt was unaware that this was needed, but states she had a procedure in December.    Nurse explained that even if test was done, her Cardiologist needs to give a clearance for [place] to proceed.  According to appt record, she had an appt with Cardiologist 12/02/20 but was a no show.  Last visit was 06/2020.  Procedure for carotid stenosis evaluation  was 02/25/2021 .  Note on procedure from Cardiologist states that plaque has regressed and to return in 1 yr.  Pt called back stating that [facility] needed to send request for clearance.  Discussed with Hilda Lias, who sent Cardiac clearance request to Dr. Odis Hollingshead.  Will keep note open until clearance received.

## 2021-04-15 NOTE — Telephone Encounter (Signed)
I faxed clearance request to Dr.Tolia's office.

## 2021-04-15 NOTE — Telephone Encounter (Signed)
Inbound call from patient states cardiology would like a clearance fax

## 2021-04-16 ENCOUNTER — Telehealth: Payer: Self-pay | Admitting: Cardiology

## 2021-04-16 DIAGNOSIS — Z01810 Encounter for preprocedural cardiovascular examination: Secondary | ICD-10-CM

## 2021-04-16 NOTE — Telephone Encounter (Signed)
We sent it back to your office. Please acknowledge.  Nissa Stannard [place], DO, [facility]

## 2021-04-16 NOTE — Telephone Encounter (Signed)
PRE-PROCEDURALCARDIAC RISK ASSESSMENT  04/16/21  Patient's name: Sandra Murray.   MRN: [id number].    DOB: [birth year]  Primary care provider: Audley Hose, MD.   Sandra Murray is a 61 y.o. female for which I have been requested to assess estimated cardiac risk for planned colonoscopy.  Patient presents low cardiac risk for the planned noncardiac procedure.  Using the preoperative risk assessment guidelines as published by the [employer] of Cardiology, the patient presents low  cardiac risk for the planned noncardiac procedure because there is no history of recent unstable angina pectoris or myocardial infarction, no signs or symptoms of decompensated CHF, no unaddressed complex dysrhythmia, no significant aortic valvular stenosis, and the patient is capable of greater than 4 METs of workload.  Total time spent: 5 minutes  Sincerely,   Rex Kras, [place], [facility]  Pager: [phone] Office: [phone]

## 2021-04-17 NOTE — Telephone Encounter (Signed)
Noted. I see the clearance. Thank you, Darrelle Wiberg,RN

## 2021-04-28 NOTE — Telephone Encounter (Signed)
Cardiac clearance received 04/16/2021

## 2021-04-29 ENCOUNTER — Ambulatory Visit (AMBULATORY_SURGERY_CENTER): Payer: Self-pay | Admitting: *Deleted

## 2021-04-29 ENCOUNTER — Other Ambulatory Visit: Payer: Self-pay

## 2021-04-29 VITALS — Ht 64.0 in | Wt 141.8 lb

## 2021-04-29 DIAGNOSIS — Z1211 Encounter for screening for malignant neoplasm of colon: Secondary | ICD-10-CM

## 2021-04-29 MED ORDER — NA SULFATE-K SULFATE-MG SULF 17.5-3.13-1.6 GM/177ML PO SOLN: 2.0000 | Freq: Once | ORAL | 0 refills | Status: AC

## 2021-04-29 NOTE — Progress Notes (Signed)
No egg or soy allergy known to patient  No issues known to pt with past sedation with any surgeries or procedures Patient denies ever being told they had issues or difficulty with intubation  No FH of Malignant Hyperthermia Pt is not on diet pills Pt is not on  home 02  Pt is not on blood thinners  Pt denies issues with constipation  No A fib or A flutter  Pt is fully vaccinated  for Covid   Due to the COVID-19 pandemic we are asking patients to follow certain guidelines in PV and the [facility]   Pt aware of COVID protocols and [facility] guidelines   PV completed in person. Pt verified name, DOB, address and insurance during PV today.   Pt encouraged to call with questions or issues.

## 2021-05-04 DIAGNOSIS — M436 Torticollis: Secondary | ICD-10-CM | POA: Diagnosis not present

## 2021-05-05 DIAGNOSIS — I10 Essential (primary) hypertension: Secondary | ICD-10-CM | POA: Diagnosis not present

## 2021-05-05 DIAGNOSIS — M791 Myalgia, unspecified site: Secondary | ICD-10-CM | POA: Diagnosis not present

## 2021-05-11 ENCOUNTER — Encounter: Payer: Self-pay | Admitting: Gastroenterology

## 2021-05-12 ENCOUNTER — Encounter: Payer: Self-pay | Admitting: Certified Registered Nurse Anesthetist

## 2021-05-13 ENCOUNTER — Other Ambulatory Visit: Payer: Self-pay

## 2021-05-13 ENCOUNTER — Encounter: Payer: Self-pay | Admitting: Gastroenterology

## 2021-05-13 ENCOUNTER — Telehealth: Payer: Self-pay

## 2021-05-13 ENCOUNTER — Ambulatory Visit (AMBULATORY_SURGERY_CENTER): Payer: BC Managed Care – PPO | Admitting: Gastroenterology

## 2021-05-13 VITALS — BP 145/68 | HR 69 | Temp 96.8°F | Resp 12 | Ht 64.0 in | Wt 141.0 lb

## 2021-05-13 DIAGNOSIS — K5900 Constipation, unspecified: Secondary | ICD-10-CM

## 2021-05-13 DIAGNOSIS — Z1211 Encounter for screening for malignant neoplasm of colon: Secondary | ICD-10-CM

## 2021-05-13 MED ORDER — SODIUM CHLORIDE 0.9 % IV SOLN: 500.0000 mL | Freq: Once | INTRAVENOUS | Status: DC

## 2021-05-13 NOTE — Progress Notes (Signed)
Referring Provider: Harvest Forest, MD Primary Care Physician:  Harvest Forest, MD  Reason for Procedure:  Screening colonoscopy   IMPRESSION:  Screening colonoscopy History of iron deficiency anemia Appropriate candidate for monitored anesthesia care  PLAN: Colonoscopy in the [facility] today    HPI: Sandra Murray is a 61 y.o. female presents for screening colonoscopy.   No prior colonoscopy or colon cancer screening.  Seen in the office 11/13/20 for evaluation of iron deficiency anemia. Patient declined EGD during office evaluation 11/13/20, although today does not remember speaking to anyone about an endoscopy.  Hemoglobin 11.7 02/18/21 up from 10.8.   No known family history of colon cancer or polyps. No family history of uterine/endometrial cancer, pancreatic cancer or gastric/stomach cancer.   Past Medical History:  Diagnosis Date   Anemia    Carotid artery stenosis, asymptomatic, bilateral    Graves disease     History reviewed. No pertinent surgical history.  Current Outpatient Medications  Medication Sig Dispense Refill   Cholecalciferol (VITAMIN D3) 25 MCG (1000 UT) CAPS Take by mouth. Take 1 tablet by mouth three times weekly.     Cyanocobalamin (VITAMIN B 12 PO) Take 1 mg by mouth daily.     ibuprofen (ADVIL) 600 MG tablet Take 600 mg by mouth daily.     methimazole (TAPAZOLE) 5 MG tablet TAKE 1 TABLET BY MOUTH EVERY DAY 90 tablet 1   Omega-3 Fatty Acids (FISH OIL) 1000 MG CAPS Take by mouth.     riboflavin (VITAMIN B-2) 100 MG TABS tablet Take 1 tablet by mouth daily.     Selenium 200 MCG CAPS Take 1 tablet by mouth 2 (two) times daily.     atenolol (TENORMIN) 25 MG tablet Take 1 tablet (25 mg total) by mouth daily. (Patient not taking: Reported on 04/29/2021) 90 tablet 3   calcium-vitamin D (OSCAL WITH D) 500-200 MG-UNIT tablet Take 1 mcg by mouth daily. (Patient not taking: Reported on 04/29/2021)     Ferrous Sulfate (SLOW FE PO)  (Patient not  taking: Reported on 04/29/2021)     methocarbamol (ROBAXIN) 500 MG tablet Take 500 mg by mouth daily.     Current Facility-Administered Medications  Medication Dose Route Frequency Provider Last Rate Last Admin   0.9 %  sodium chloride infusion  500 mL Intravenous Once Tressia Danas, MD        Allergies as of 05/13/2021   (Not on File)    Family History  Problem Relation Age of Onset   Stroke Mother    Heart disease Mother    Alzheimer's disease Father    Colon cancer Neg Hx    Colon polyps Neg Hx    Esophageal cancer Neg Hx    Stomach cancer Neg Hx    Rectal cancer Neg Hx      Physical Exam: General:   Alert,  well-nourished, pleasant and cooperative in NAD Head:  Normocephalic and atraumatic. Eyes:  Sclera clear, no icterus.   Conjunctiva pink. Mouth:  No deformity or lesions.   Neck:  Supple; no masses or thyromegaly. Lungs:  Clear throughout to auscultation.   No wheezes. Heart:  Regular rate and rhythm; no murmurs. Abdomen:  Soft, non-tender, nondistended, normal bowel sounds, no rebound or guarding.  Msk:  Symmetrical. No boney deformities LAD: No inguinal or umbilical LAD Extremities:  No clubbing or edema. Neurologic:  Alert and  oriented x4;  grossly nonfocal Skin:  No obvious rash or bruise. Psych:  Alert and cooperative.  Normal mood and affect.     Studies/Results: No results found.    Greco Gastelum L. Orvan Falconer, MD, MPH 05/13/2021, 8:08 AM

## 2021-05-13 NOTE — Telephone Encounter (Signed)
Per Dr. Orvan Falconer request, pt has been scheduled for f/u with Hyacinth Meeker, PA-C on 06/01/21 @ 330pm. Appt reminder has been mailed. Appt will reflect on AVS upon [facility] discharge for pt future reference.

## 2021-05-13 NOTE — Patient Instructions (Signed)
Follow a high fiber diet. Resume previous medications. Repeat Colonoscopy in 10 years for surveillance. Follow up in office to further evaluate iron deficiency anemia   YOU HAD AN ENDOSCOPIC PROCEDURE TODAY AT THE [facility]:   Refer to the procedure report that was given to you for any specific questions about what was found during the examination.  If the procedure report does not answer your questions, please call your gastroenterologist to clarify.  If you requested that your care partner not be given the details of your procedure findings, then the procedure report has been included in a sealed envelope for you to review at your convenience later.  YOU SHOULD EXPECT: Some feelings of bloating in the abdomen. Passage of more gas than usual.  Walking can help get rid of the air that was put into your GI tract during the procedure and reduce the bloating. If you had a lower endoscopy (such as a colonoscopy or flexible sigmoidoscopy) you may notice spotting of blood in your stool or on the toilet paper. If you underwent a bowel prep for your procedure, you may not have a normal bowel movement for a few days.  Please Note:  You might notice some irritation and congestion in your nose or some drainage.  This is from the oxygen used during your procedure.  There is no need for concern and it should clear up in a day or so.  SYMPTOMS TO REPORT IMMEDIATELY:  Following lower endoscopy (colonoscopy or flexible sigmoidoscopy):  Excessive amounts of blood in the stool  Significant tenderness or worsening of abdominal pains  Swelling of the abdomen that is new, acute  Fever of 100F or higher  For urgent or emergent issues, a gastroenterologist can be reached at any hour by calling [phone]. Do not use MyChart messaging for urgent concerns.    DIET:  We do recommend a small meal at first, but then you may proceed to your regular diet.  Drink plenty of fluids but you should avoid  alcoholic beverages for 24 hours.  ACTIVITY:  You should plan to take it easy for the rest of today and you should NOT DRIVE or use heavy machinery until tomorrow (because of the sedation medicines used during the test).    FOLLOW UP: Our staff will call the number listed on your records 48-72 hours following your procedure to check on you and address any questions or concerns that you may have regarding the information given to you following your procedure. If we do not reach you, we will leave a message.  We will attempt to reach you two times.  During this call, we will ask if you have developed any symptoms of COVID 19. If you develop any symptoms (ie: fever, flu-like symptoms, shortness of breath, cough etc.) before then, please call [phone].  If you test positive for Covid 19 in the 2 weeks post procedure, please call and report this information to [place].    If any biopsies were taken you will be contacted by phone or by letter within the next 1-3 weeks.  Please call us at [phone] if you have not heard about the biopsies in 3 weeks.    SIGNATURES/CONFIDENTIALITY: You and/or your care partner have signed paperwork which will be entered into your electronic medical record.  These signatures attest to the fact that that the information above on your After Visit Summary has been reviewed and is understood.  Full responsibility of the confidentiality of this discharge  information lies with you and/or your care-partner.

## 2021-05-13 NOTE — Progress Notes (Signed)
Vitals-KW  Pt's states no medical or surgical changes since previsit or office visit. 

## 2021-05-13 NOTE — Addendum Note (Signed)
Addended by: Buck Mam on: 05/13/2021 09:26 AM   Modules accepted: Orders

## 2021-05-13 NOTE — Op Note (Signed)
[facility] Patient Name: Sandra Murray Procedure Date: 05/13/2021 7:13 AM MRN: [id number] Endoscopist: Tressia Danas MD, MD Age: 61 Referring MD:  Date of Birth: 1960-05-03 Gender: Female Account #: [id number] Procedure:                Colonoscopy Indications:              Screening for colorectal malignant neoplasm, This                            is the patient's first colonoscopy Medicines:                Monitored Anesthesia Care Procedure:                Pre-Anesthesia Assessment:                           - Prior to the procedure, a History and Physical                            was performed, and patient medications and                            allergies were reviewed. The patient's tolerance of                            previous anesthesia was also reviewed. The risks                            and benefits of the procedure and the sedation                            options and risks were discussed with the patient.                            All questions were answered, and informed consent                            was obtained. Prior Anticoagulants: The patient has                            taken no previous anticoagulant or antiplatelet                            agents. ASA Grade Assessment: III - A patient with                            severe systemic disease. After reviewing the risks                            and benefits, the patient was deemed in                            satisfactory condition to undergo the procedure.  After obtaining informed consent, the colonoscope                            was passed under direct vision. Throughout the                            procedure, the patient's blood pressure, pulse, and                            oxygen saturations were monitored continuously. The                            Olympus CF-HQ190L ([id number]) Colonoscope was                            introduced  through the anus and advanced to the 3                            cm into the ileum. A second forward view of the                            right colon was performed. The colonoscopy was                            performed without difficulty. The patient tolerated                            the procedure well. The quality of the bowel                            preparation was good. The terminal ileum, ileocecal                            valve, appendiceal orifice, and rectum were                            photographed. Scope In: 8:15:23 AM Scope Out: 8:31:48 AM Scope Withdrawal Time: 0 hours 12 minutes 24 seconds  Total Procedure Duration: 0 hours 16 minutes 25 seconds  Findings:                 The perianal and digital rectal examinations were                            normal.                           Non-bleeding internal hemorrhoids were found.                           A few small and large-mouthed diverticula were                            found in the sigmoid colon and ascending colon.  The exam was otherwise without abnormality on                            direct and retroflexion views. Complications:            No immediate complications. Estimated Blood Loss:     Estimated blood loss: none. Impression:               - Non-bleeding internal hemorrhoids.                           - Diverticulosis in the sigmoid colon and in the                            ascending colon.                           - The examination was otherwise normal on direct                            and retroflexion views.                           - No specimens collected. Recommendation:           - Patient has a contact number available for                            emergencies. The signs and symptoms of potential                            delayed complications were discussed with the                            patient. Return to normal activities tomorrow.                             Written discharge instructions were provided to the                            patient.                           - Resume previous diet. High fiber diet.                           - Continue present medications.                           - I recommend office follow-up to further evaluate                            your iron deficiency anemia.                           - Repeat colonoscopy in 10 years for surveillance,  earlier with new symptoms.                           - Follow a high fiber diet. Drink at least 64                            ounces of water daily. Add a daily stool bulking                            agent such as psyllium (an exampled would be                            Metamucil).                           - Emerging evidence supports eating a diet of                            fruits, vegetables, grains, calcium, and yogurt                            while reducing red meat and alcohol may reduce the                            risk of colon cancer.                           - Thank you for allowing me to be involved in your                            colon cancer prevention. Tressia Danas MD, MD 05/13/2021 8:41:51 AM This report has been signed electronically.

## 2021-05-13 NOTE — Telephone Encounter (Signed)
-----   Message from Tressia Danas, MD sent at 05/13/2021  8:42 AM EST ----- Please schedule office follow-up with Sandra Murray given her history of iron deficiency anemia.  Thanks.  KLB

## 2021-05-13 NOTE — Progress Notes (Signed)
Report given to PACU, vss 

## 2021-05-15 ENCOUNTER — Telehealth: Payer: Self-pay

## 2021-05-15 ENCOUNTER — Telehealth: Payer: Self-pay | Admitting: *Deleted

## 2021-05-15 NOTE — Telephone Encounter (Signed)
Left message on follow up call. 

## 2021-05-15 NOTE — Telephone Encounter (Signed)
°  Follow up Call-  Call back number 05/13/2021  Post procedure Call Back phone  # [phone]  Permission to leave phone message Yes  Some recent data might be hidden     Patient questions:  Do you have a fever, pain , or abdominal swelling? No. Pain Score  0 *  Have you tolerated food without any problems? Yes.    Have you been able to return to your normal activities? Yes.    Do you have any questions about your discharge instructions: Diet   No. Medications  No. Follow up visit  No.  Do you have questions or concerns about your Care? No.  Actions: * If pain score is 4 or above: No action needed, pain <4.  Have you developed a fever since your procedure? no  2.   Have you had an respiratory symptoms (SOB or cough) since your procedure? no  3.   Have you tested positive for COVID 19 since your procedure no  4.   Have you had any family members/close contacts diagnosed with the COVID 19 since your procedure?  no   If yes to any of these questions please route to Laverna Peace, RN and Karlton Lemon, RN

## 2021-06-01 ENCOUNTER — Ambulatory Visit: Payer: BC Managed Care – PPO | Admitting: Physician Assistant

## 2021-06-01 NOTE — Therapy (Incomplete)
OUTPATIENT PHYSICAL THERAPY CERVICAL EVALUATION   Patient Name: Sandra Murray MRN: [id number] DOB:22-Sep-1960, 61 y.o., female Today's Date: 06/01/2021    Past Medical History:  Diagnosis Date   Anemia    Carotid artery stenosis, asymptomatic, bilateral    Graves disease    No past surgical history on file. Patient Active Problem List   Diagnosis Date Noted   Graves disease 02/24/2018   Vitamin D deficiency 02/24/2018     REFERRING PROVIDER: Harvest Forest, MD  REFERRING DIAG: M54.2 (ICD-10-CM) - Cervicalgia   THERAPY DIAG:  No diagnosis found.  ONSET DATE: ***  SUBJECTIVE:                                                                                                                                                                                                         SUBJECTIVE STATEMENT: Pt reports acute onset of Lt sided neck and shoulder pain on waking 04/30/21.  Thinks she may have slept wrong.  Has tried muscle relaxers and prednisone taper pack.    PERTINENT HISTORY:  HLD, anxiety, HTN, carotid artery stenosis, Graves disease, hyperthyroidism  PAIN:  Are you having pain? {OPRCPAIN:27236}  PRECAUTIONS: None  WEIGHT BEARING RESTRICTIONS No  FALLS:  Has patient fallen in last 6 months? {yes/no:20286} Number of falls: ***  LIVING ENVIRONMENT: Lives with: {OPRC lives with:25569::"lives with their family"} Lives in: {Lives in:25570} Stairs: {yes/no:20286}; {Stairs:24000} Has following equipment at home: {Assistive devices:23999}  OCCUPATION: ***  PLOF: {PLOF:24004}  PATIENT GOALS ***  OBJECTIVE:   DIAGNOSTIC FINDINGS:  ***  PATIENT SURVEYS:  {rehab surveys:24030}   COGNITION: Overall cognitive status: {cognition:24006}   SENSATION: {sensation:27233}  POSTURE:  ***  PALPATION: ***   CERVICAL ROM:   {AROM/PROM:27142} ROM A/PROM (deg) 06/01/2021  Flexion   Extension   Right lateral flexion   Left lateral flexion    Right rotation   Left rotation    (Blank rows = not tested)  UE ROM:  {AROM/PROM:27142} ROM Right 06/01/2021 Left 06/01/2021  Shoulder flexion    Shoulder extension    Shoulder abduction    Shoulder adduction    Shoulder extension    Shoulder internal rotation    Shoulder external rotation    Elbow flexion    Elbow extension    Wrist flexion    Wrist extension    Wrist ulnar deviation    Wrist radial deviation    Wrist pronation    Wrist supination     (Blank rows = not tested)  UE MMT:  MMT Right 06/01/2021 Left 06/01/2021  Shoulder  flexion    Shoulder extension    Shoulder abduction    Shoulder adduction    Shoulder extension    Shoulder internal rotation    Shoulder external rotation    Middle trapezius    Lower trapezius    Elbow flexion    Elbow extension    Wrist flexion    Wrist extension    Wrist ulnar deviation    Wrist radial deviation    Wrist pronation    Wrist supination    Grip strength     (Blank rows = not tested)  CERVICAL SPECIAL TESTS:  {Cervical special tests:25246}   FUNCTIONAL TESTS:  {Functional tests:24029}  PATIENT SURVEYS:  {rehab surveys:24030:a}  TODAY'S TREATMENT:  ***   PATIENT EDUCATION:  Education details: *** Person educated: {Person educated:25204} Education method: {Education Method:25205} Education comprehension: {Education Comprehension:25206}   HOME EXERCISE PROGRAM: ***  ASSESSMENT:  CLINICAL IMPRESSION: Patient is a *** y.o. *** who was seen today for physical therapy evaluation and treatment for ***.    OBJECTIVE IMPAIRMENTS {opptimpairments:25111}.   ACTIVITY LIMITATIONS {activity limitations:25113}.   PERSONAL FACTORS {Personal factors:25162} are also affecting patient's functional outcome.    REHAB POTENTIAL: {rehabpotential:25112}  CLINICAL DECISION MAKING: {clinical decision making:25114}  EVALUATION COMPLEXITY: {Evaluation complexity:25115}   GOALS: Goals reviewed with patient?  {yes/no:20286}  SHORT TERM GOALS: Target date: {follow up:25551}  *** Baseline: *** Goal status: {GOALSTATUS:25110}  2.  *** Baseline: *** Goal status: {GOALSTATUS:25110}  3.  *** Baseline: *** Goal status: {GOALSTATUS:25110}  4.  *** Baseline: *** Goal status: {GOALSTATUS:25110}  5.  *** Baseline: *** Goal status: {GOALSTATUS:25110}  6.  *** Baseline: *** Goal status: {GOALSTATUS:25110}  LONG TERM GOALS: Target date: {follow up:25551}  *** Baseline: *** Goal status: {GOALSTATUS:25110}  2.  *** Baseline: *** Goal status: {GOALSTATUS:25110}  3.  *** Baseline: *** Goal status: {GOALSTATUS:25110}  4.  *** Baseline: *** Goal status: {GOALSTATUS:25110}  5.  *** Baseline: *** Goal status: {GOALSTATUS:25110}  6.  *** Baseline: *** Goal status: {GOALSTATUS:25110}   PLAN: PT FREQUENCY: {rehab frequency:25116}  PT DURATION: {rehab duration:25117}  PLANNED INTERVENTIONS: {rehab planned interventions:25118::"Therapeutic exercises","Therapeutic activity","Neuromuscular re-education","Balance training","Gait training","Patient/Family education","Joint mobilization"}  PLAN FOR NEXT SESSION: ***   Enmanuel Zufall E Keivon Garden, PT 06/01/2021, 7:03 PM

## 2021-06-02 ENCOUNTER — Encounter: Payer: Self-pay | Admitting: Physical Therapy

## 2021-06-02 ENCOUNTER — Other Ambulatory Visit: Payer: Self-pay

## 2021-06-02 ENCOUNTER — Ambulatory Visit: Payer: BC Managed Care – PPO | Attending: Internal Medicine | Admitting: Physical Therapy

## 2021-06-02 DIAGNOSIS — R252 Cramp and spasm: Secondary | ICD-10-CM | POA: Insufficient documentation

## 2021-06-02 DIAGNOSIS — M542 Cervicalgia: Secondary | ICD-10-CM | POA: Diagnosis not present

## 2021-06-10 ENCOUNTER — Encounter: Payer: BC Managed Care – PPO | Admitting: Physical Therapy

## 2021-06-18 ENCOUNTER — Encounter: Payer: BC Managed Care – PPO | Admitting: Physical Therapy

## 2021-06-25 ENCOUNTER — Encounter: Payer: BC Managed Care – PPO | Admitting: Physical Therapy

## 2021-07-28 DIAGNOSIS — Z1231 Encounter for screening mammogram for malignant neoplasm of breast: Secondary | ICD-10-CM | POA: Diagnosis not present

## 2021-07-28 DIAGNOSIS — Z1239 Encounter for other screening for malignant neoplasm of breast: Secondary | ICD-10-CM | POA: Diagnosis not present

## 2021-07-28 DIAGNOSIS — Z6820 Body mass index (BMI) 20.0-20.9, adult: Secondary | ICD-10-CM | POA: Diagnosis not present

## 2021-07-28 DIAGNOSIS — Z780 Asymptomatic menopausal state: Secondary | ICD-10-CM | POA: Diagnosis not present

## 2021-07-28 DIAGNOSIS — Z01419 Encounter for gynecological examination (general) (routine) without abnormal findings: Secondary | ICD-10-CM | POA: Diagnosis not present

## 2021-07-30 DIAGNOSIS — F419 Anxiety disorder, unspecified: Secondary | ICD-10-CM | POA: Diagnosis not present

## 2021-07-30 DIAGNOSIS — E785 Hyperlipidemia, unspecified: Secondary | ICD-10-CM | POA: Diagnosis not present

## 2021-07-30 DIAGNOSIS — I10 Essential (primary) hypertension: Secondary | ICD-10-CM | POA: Diagnosis not present

## 2021-07-30 DIAGNOSIS — I6529 Occlusion and stenosis of unspecified carotid artery: Secondary | ICD-10-CM | POA: Diagnosis not present

## 2021-08-19 ENCOUNTER — Encounter: Payer: Self-pay | Admitting: Internal Medicine

## 2021-08-19 ENCOUNTER — Ambulatory Visit (INDEPENDENT_AMBULATORY_CARE_PROVIDER_SITE_OTHER): Payer: BC Managed Care – PPO | Admitting: Internal Medicine

## 2021-08-19 VITALS — BP 114/70 | HR 74 | Ht 64.0 in | Wt 142.8 lb

## 2021-08-19 DIAGNOSIS — E0500 Thyrotoxicosis with diffuse goiter without thyrotoxic crisis or storm: Secondary | ICD-10-CM | POA: Diagnosis not present

## 2021-08-19 DIAGNOSIS — E559 Vitamin D deficiency, unspecified: Secondary | ICD-10-CM

## 2021-08-19 LAB — T4, FREE: Free T4: 0.79 ng/dL (ref 0.60–1.60)

## 2021-08-19 LAB — TSH: TSH: 1.07 u[IU]/mL (ref 0.35–5.50)

## 2021-08-19 LAB — T3, FREE: T3, Free: 3.7 pg/mL (ref 2.3–4.2)

## 2021-08-19 NOTE — Progress Notes (Signed)
Patient ID: Sandra Murray, female   DOB: [birth year], 61 y.o.   MRN: [id number]   This visit occurred during the SARS-CoV-2 public health emergency.  Safety protocols were in place, including screening questions prior to the visit, additional usage of staff PPE, and extensive cleaning of exam room while observing appropriate contact time as indicated for disinfecting solutions.   HPI  Sandra Murray is a 61 y.o.-year-old female, initially referred by her gastroenterologist, Dr. Loreta Ave, returning for follow-up for Graves' disease.  Last visit 6 months ago.  Interim history: At this visit, she denies: Tremors, palpitations, chest pain, heat intolerance, cold intolerance, constipation. She feels good. She exercise by walking and also dances.  She would like to lose weight to get back to 135 pounds.  Reviewed and addended history: She started to have some  weight loss and bone pain in 2018 >> presented to see PCP  - TSH was undetectable. She was sent to a rheumatologist for suspicion for RA >> testing was negative.  No intervention was suggested for her abnormal thyroid tests then.  In 10/2017, she started to have: - palpitations - whooshing sound in year - lost weight (also very stressed as she is taking care of her father who has dementia) - tremors - anxiety - insomnia  She presented to see her PCP (Dr. Leavy Cella) >> TSH was again undetectable.  Again, no intervention was suggested.  She then saw Dr. Loreta Ave for a screening colonoscopy >> Dr. Loreta Ave referred her to me urgently for her thyrotoxicosis.  We initially started: -Methimazole 10 mg 2x a day -Atenolol 25 mg daily She did not return for repeat TFTs 1 month after starting the medication (miscommunication?).  However, when she returned to the clinic for another she was telling me that she felt much better.  She gained weight, did not have palpitations or tremors.  At that time, her TSH was very high, at 46.86.  We decrease the  methimazole to 5 mg daily and ended up stopping it completely in 06/2018.  At that time, we also started to taper down atenolol and then stop completely.  Subsequent TFTs were normal in 07/2018, however, she called [place] that she started to have hyperthyroid symptoms again and we restarted a low-dose methimazole, 5 mg daily.  However, in 09/2018, her TSH was again high, at 15.86 I will stop methimazole.  She did not return for repeat labs, and presented to the emergency room in 01/2019 with tachycardia, palpitations, and the TSH was found to be undetectable.  We restarted methimazole, at 5 mg twice a day.  She called [place] in 08/2018 for diplopia and I directed her to her ophthalmologist.  She actually saw a optometrist who noticed an abnormality in her gaze and told her that this is possibly related to the thyroid condition, but did not suggest any other follow-up or treatment.  I referred her to Dr. Toni Arthurs and she did establish care with her.  She initialy had diplopia especially when looking up, then much better on diclofenac - now stopped.  02/2019: Reduced methimazole dose to 5 mg daily  03/2019: We stopped methimazole as TSH was elevated.  07/2019: We restarted methimazole 5 mg daily  11/2019: Stopped methimazole as TSH was again elevated.  01/2020: Restarted MMI 2.5 mg daily  03/2020: Developed headaches and heart rates in the 120s to 130s  04/2020: Started atenolol 25 mg prn by PCP - ran out  I reviewed her TFTs: Lab Results  Component Value Date  TSH 1.94 02/18/2021   TSH 0.93 08/13/2020   TSH 0.46 06/30/2020   TSH <0.01 (L) 03/26/2020   TSH <0.01 (L) 02/12/2020   TSH 11.12 (H) 12/19/2019   TSH 3.63 11/16/2019   TSH 0.01 (L) 09/27/2019   TSH <0.01 (L) 08/15/2019   TSH 7.54 (H) 04/20/2019   FREET4 0.76 02/18/2021   FREET4 0.70 08/13/2020   FREET4 0.64 06/30/2020   FREET4 0.70 03/26/2020   FREET4 1.15 02/12/2020   FREET4 0.56 (L) 12/19/2019   FREET4 0.64 11/16/2019   FREET4  0.67 09/27/2019   FREET4 1.32 08/15/2019   FREET4 0.48 (L) 04/20/2019   T3FREE 3.6 02/18/2021   T3FREE 2.9 08/13/2020   T3FREE 3.0 06/30/2020   T3FREE 3.1 03/26/2020   T3FREE 4.3 (H) 02/12/2020   T3FREE 3.2 12/19/2019   T3FREE 3.1 11/16/2019   T3FREE 3.2 09/27/2019   T3FREE 4.6 (H) 08/15/2019   T3FREE 2.9 04/20/2019  04/05/2020: TSH <0.01 02/08/2018: TSH <0.010 12/15/2016: TSH <0.010 12/18/2015: TSH 2.192  TSI's are elevated: Lab Results  Component Value Date   TSI 571 (H) 02/18/2021   TSI 450 (H) 08/15/2019   TSI 389 (H) 10/09/2018   TSI 504 (H) 02/24/2018   Pt denies: - feeling nodules in neck - hoarseness - dysphagia - choking - SOB with lying down  No FH of thyroid disease or cancer. No h/o radiation tx to head or neck. No herbal supplements. No Biotin use. No recent steroids use.   Vitamin deficiency   Reviewed vitamin D levels: 02/18/2021: vitamin D 50 Lab Results  Component Value Date   VD25OH 57.7 08/13/2020   VD25OH 52.1 08/15/2019   VD25OH 36.48 02/27/2019   VD25OH 28.27 (L) 10/09/2018  - level of 14 on 02/08/2018  - level of 12 on 12/18/2015  She is on 2000 units vitamin D daily.    She also has a history of anemia.  ROS:+ see HPI Cardiovascular: no CP/no SOB/+ palpitations - when stressed/no leg swelling  I reviewed pt's medications, allergies, PMH, social hx, family hx, and changes were documented in the history of present illness. Otherwise, unchanged from my initial visit note.  PMH: -See above  No past surgical history  Social History   Socioeconomic History   Marital status: Married    Spouse name: Not on file   Number of children: 0   Years of education: Not on file   Highest education level: Not on file  Occupational History   Director of Human Resources  Tobacco Use   Smoking status: Never Smoker   Smokeless tobacco: Never Used  Substance and Sexual Activity   Alcohol use: No   Drug use: No   Current Outpatient  Medications  Medication Sig Dispense Refill   atenolol (TENORMIN) 25 MG tablet Take 1 tablet (25 mg total) by mouth daily. (Patient not taking: Reported on 04/29/2021) 90 tablet 3   calcium-vitamin D (OSCAL WITH D) 500-200 MG-UNIT tablet Take 1 mcg by mouth daily. (Patient not taking: Reported on 04/29/2021)     Cholecalciferol (VITAMIN D3) 25 MCG (1000 UT) CAPS Take by mouth. Take 1 tablet by mouth three times weekly.     Cyanocobalamin (VITAMIN B 12 PO) Take 1 mg by mouth daily.     Ferrous Sulfate (SLOW FE PO)  (Patient not taking: Reported on 04/29/2021)     ibuprofen (ADVIL) 600 MG tablet Take 600 mg by mouth daily.     methimazole (TAPAZOLE) 5 MG tablet TAKE 1 TABLET BY MOUTH EVERY  DAY 90 tablet 1   methocarbamol (ROBAXIN) 500 MG tablet Take 500 mg by mouth daily.     Omega-3 Fatty Acids (FISH OIL) 1000 MG CAPS Take by mouth.     riboflavin (VITAMIN B-2) 100 MG TABS tablet Take 1 tablet by mouth daily.     Selenium 200 MCG CAPS Take 1 tablet by mouth 2 (two) times daily.     No current facility-administered medications for this visit.    Not on File   Family History  Problem Relation Age of Onset   Stroke Mother    Heart disease Mother    Alzheimer's disease Father    Colon cancer Neg Hx    Colon polyps Neg Hx    Esophageal cancer Neg Hx    Stomach cancer Neg Hx    Rectal cancer Neg Hx     Mother  - heart ds Father  - Prostate cancer  PE: BP 114/70 (BP Location: Left Arm, Patient Position: Sitting, Cuff Size: Small)   Pulse 74   Ht 5\' 4"  (1.626 m)   Wt 142 lb 12.8 oz (64.8 kg)   SpO2 98%   BMI 24.51 kg/m  Wt Readings from Last 3 Encounters:  08/19/21 142 lb 12.8 oz (64.8 kg)  05/13/21 141 lb (64 kg)  04/29/21 141 lb 12.8 oz (64.3 kg)   Constitutional: normal weight, in NAD Eyes: PERRLA, EOMI, + exophthalmos, + stare ENT: moist mucous membranes, no thyromegaly, no cervical lymphadenopathy Cardiovascular: RRR, No RG, +1/6 SEM Respiratory: CTA B Musculoskeletal: no  deformities Skin: moist, warm, no rashes Neurological: no tremor with outstretched hands, DTR normal in all 4  ASSESSMENT: 1. Graves ds.  2.  Diplopia  3. Vit D deficiency  PLAN:  1. Patient with history of low TSH, confirmed as Graves' disease, based on high TSI antibodies.  She initially had thyrotoxic symptoms including weight loss, palpitations, tremors, anxiety, which resolved after starting methimazole.  We discussed in the past about possible consequences of uncontrolled thyrotoxicosis to include arrhythmia, heart failure, coagulability with the risk of stroke, PE, AMI, sudden death.  We initially started methimazole and atenolol, however, she did not return for labs and the next TSH returned very high, at 47.  At that time, we decreased her methimazole dose and ended up stopping methimazole completely in 06/2018.  However, afterwards, she had return of thyrotoxicosis symptoms and we had to restart methimazole 5 mg daily.  In 09/2018, we again stopped methimazole as her TSH returned 16.  She presented to the emergency room with tachycardia and palpitations in 01/2019 and methimazole was restarted at that time.  A subsequent TSH was again high in 11/2019 so we stopped methimazole again.  We restarted methimazole 2.5 mg daily in 01/2020.  At last visit, in 07/2020, TFTs were normal so we continued the same dose of methimazole. -Due to the variability in her TFTs and inability to regulate her thyroid status I strongly suggested definitive treatment for her Luiz Blare' disease in the past in the form of RAI treatment or surgery (we discussed that this could be done with transaxillary approach).  Due to mild exophthalmos, she would probably be a better candidate for surgery, rather than RAI, but RAI could be done with caution.  Unfortunately, she declines these. -For her exophthalmos with diplopia with upward gaze, I referred her to Dr. Ralene Muskrat at [facility].  She was started on diclofenac and this  helped.  She was able to come off afterwards.  Solu-Medrol  and Alveda Reasons were considered but were not needed.  She continues on selenium.  She sees Dr. Toni Arthurs regularly. -She was able to come off atenolol in the past but she restarted it due to high heart rate and palpitations.  At last visit we discussed about restarting the atenolol.  She was monitoring her heart rate with her smart watch and she was telling me that her pulse could be over 90 at rest.  With exercise, it was increasing to 140s.  At this visit, she continues to walk for exercise and also dance.  Her heart rate continues to increase to 140s during these activities. -TSH was normal at last visit so we continued the same dose of methimazole -At today's visit, we will recheck her TFTs and change the methimazole dose accordingly.  I would be reticent to stop methimazole completely due to the above history-if we need to decrease the dose, we most likely need to change to 2.5 mg every other day and do a very slow taper -At last visit, TSI's were still elevated.  We will recheck them today as she has an appointment with Dr. Toni Arthurs next week. -I will see her back in 6 months, but possibly sooner for labs  2.  Vit D def -Continue 2000 units vitamin D daily -no generalized joint pains -Vitamin D level was excellent at last check 6 months ago: 81 - by Dr. Leavy Cella Lab Results  Component Value Date   VD25OH 57.7 08/13/2020   VD25OH 52.1 08/15/2019   VD25OH 36.48 02/27/2019   VD25OH 28.27 (L) 10/09/2018  -We will repeat the level at next visit  Component     Latest Ref Rng 08/19/2021  T4,Free(Direct)     0.60 - 1.60 ng/dL 1.61   Triiodothyronine,Free,Serum     2.3 - 4.2 pg/mL 3.7   TSH     0.35 - 5.50 uIU/mL 1.07   TSI     <140 % baseline 471 (H)   TSIs elevated, however, the rest of the tests are at goal.  Carlus Pavlov, MD PhD [facility] Endocrinology

## 2021-08-19 NOTE — Patient Instructions (Signed)
Please continue methimazole 2.5 mg daily.  Also, continue vitamin D 2000 units daily.  Please stop at the lab.  Please come back for a follow-up appointment in 6 months.

## 2021-08-22 LAB — THYROID STIMULATING IMMUNOGLOBULIN: TSI: 471 % baseline — ABNORMAL HIGH (ref <140)

## 2021-09-02 DIAGNOSIS — H04123 Dry eye syndrome of bilateral lacrimal glands: Secondary | ICD-10-CM | POA: Diagnosis not present

## 2021-09-02 DIAGNOSIS — H2513 Age-related nuclear cataract, bilateral: Secondary | ICD-10-CM | POA: Diagnosis not present

## 2021-09-02 DIAGNOSIS — H47233 Glaucomatous optic atrophy, bilateral: Secondary | ICD-10-CM | POA: Diagnosis not present

## 2021-09-02 DIAGNOSIS — E063 Autoimmune thyroiditis: Secondary | ICD-10-CM | POA: Diagnosis not present

## 2021-10-25 ENCOUNTER — Other Ambulatory Visit: Payer: Self-pay | Admitting: Internal Medicine

## 2021-11-16 DIAGNOSIS — H04123 Dry eye syndrome of bilateral lacrimal glands: Secondary | ICD-10-CM | POA: Diagnosis not present

## 2021-11-16 DIAGNOSIS — E063 Autoimmune thyroiditis: Secondary | ICD-10-CM | POA: Diagnosis not present

## 2021-11-16 DIAGNOSIS — H47233 Glaucomatous optic atrophy, bilateral: Secondary | ICD-10-CM | POA: Diagnosis not present

## 2021-11-16 DIAGNOSIS — H53122 Transient visual loss, left eye: Secondary | ICD-10-CM | POA: Diagnosis not present

## 2021-12-24 DIAGNOSIS — H47233 Glaucomatous optic atrophy, bilateral: Secondary | ICD-10-CM | POA: Diagnosis not present

## 2022-01-19 DIAGNOSIS — E0500 Thyrotoxicosis with diffuse goiter without thyrotoxic crisis or storm: Secondary | ICD-10-CM | POA: Diagnosis not present

## 2022-01-19 DIAGNOSIS — H04123 Dry eye syndrome of bilateral lacrimal glands: Secondary | ICD-10-CM | POA: Diagnosis not present

## 2022-01-19 DIAGNOSIS — H401131 Primary open-angle glaucoma, bilateral, mild stage: Secondary | ICD-10-CM | POA: Diagnosis not present

## 2022-01-19 DIAGNOSIS — H25813 Combined forms of age-related cataract, bilateral: Secondary | ICD-10-CM | POA: Diagnosis not present

## 2022-02-18 ENCOUNTER — Telehealth: Payer: Self-pay | Admitting: Cardiology

## 2022-02-18 NOTE — Telephone Encounter (Signed)
Ok done

## 2022-02-18 NOTE — Telephone Encounter (Signed)
Can someone please put in an order for this patient's carotid? Or extend the current one? She's scheduled for 03/29/2022 with her follow up the next week after.

## 2022-02-23 DIAGNOSIS — Z0000 Encounter for general adult medical examination without abnormal findings: Secondary | ICD-10-CM | POA: Diagnosis not present

## 2022-02-23 DIAGNOSIS — R030 Elevated blood-pressure reading, without diagnosis of hypertension: Secondary | ICD-10-CM | POA: Diagnosis not present

## 2022-02-23 DIAGNOSIS — E559 Vitamin D deficiency, unspecified: Secondary | ICD-10-CM | POA: Diagnosis not present

## 2022-02-25 ENCOUNTER — Other Ambulatory Visit: Payer: BC Managed Care – PPO

## 2022-03-01 ENCOUNTER — Encounter: Payer: Self-pay | Admitting: Internal Medicine

## 2022-03-01 DIAGNOSIS — R3129 Other microscopic hematuria: Secondary | ICD-10-CM | POA: Diagnosis not present

## 2022-03-03 DIAGNOSIS — R3129 Other microscopic hematuria: Secondary | ICD-10-CM | POA: Diagnosis not present

## 2022-03-03 DIAGNOSIS — R030 Elevated blood-pressure reading, without diagnosis of hypertension: Secondary | ICD-10-CM | POA: Diagnosis not present

## 2022-03-11 ENCOUNTER — Other Ambulatory Visit: Payer: Self-pay | Admitting: Internal Medicine

## 2022-03-11 DIAGNOSIS — R3129 Other microscopic hematuria: Secondary | ICD-10-CM | POA: Diagnosis not present

## 2022-03-11 DIAGNOSIS — Z0001 Encounter for general adult medical examination with abnormal findings: Secondary | ICD-10-CM | POA: Diagnosis not present

## 2022-03-11 DIAGNOSIS — E785 Hyperlipidemia, unspecified: Secondary | ICD-10-CM | POA: Diagnosis not present

## 2022-03-11 DIAGNOSIS — Z13820 Encounter for screening for osteoporosis: Secondary | ICD-10-CM | POA: Diagnosis not present

## 2022-03-11 DIAGNOSIS — Z1211 Encounter for screening for malignant neoplasm of colon: Secondary | ICD-10-CM | POA: Diagnosis not present

## 2022-03-11 DIAGNOSIS — D509 Iron deficiency anemia, unspecified: Secondary | ICD-10-CM | POA: Diagnosis not present

## 2022-03-11 DIAGNOSIS — Z23 Encounter for immunization: Secondary | ICD-10-CM | POA: Diagnosis not present

## 2022-03-11 DIAGNOSIS — I10 Essential (primary) hypertension: Secondary | ICD-10-CM | POA: Diagnosis not present

## 2022-03-19 ENCOUNTER — Encounter: Payer: Self-pay | Admitting: Internal Medicine

## 2022-03-19 ENCOUNTER — Ambulatory Visit (INDEPENDENT_AMBULATORY_CARE_PROVIDER_SITE_OTHER): Payer: BC Managed Care – PPO | Admitting: Internal Medicine

## 2022-03-19 VITALS — BP 144/92 | HR 101 | Ht 64.0 in | Wt 143.2 lb

## 2022-03-19 DIAGNOSIS — E559 Vitamin D deficiency, unspecified: Secondary | ICD-10-CM | POA: Diagnosis not present

## 2022-03-19 DIAGNOSIS — E0500 Thyrotoxicosis with diffuse goiter without thyrotoxic crisis or storm: Secondary | ICD-10-CM | POA: Diagnosis not present

## 2022-03-19 MED ORDER — ATENOLOL 25 MG PO TABS: 25.0000 mg | ORAL_TABLET | Freq: Every day | ORAL | 3 refills | Status: DC

## 2022-03-19 NOTE — Patient Instructions (Addendum)
Please continue methimazole 2.5 mg daily.  Also, continue vitamin D 2000 units daily.  Please stop at the lab.  Please come back for a follow-up appointment in 1 year but for labs in 6 months.

## 2022-03-19 NOTE — Progress Notes (Unsigned)
Patient ID: Sandra Murray, female   DOB: 07-10-1960, 61 y.o.   MRN: [id number]    HPI  Sandra Murray is a 61 y.o.-year-old female, initially referred by her gastroenterologist, Dr. Loreta Murray, returning for follow-up for Graves' disease.  Last visit 6 months ago.  Interim history: Patient denies: Tremors, palpitations, chest pain, heat intolerance, cold intolerance, constipation. At last visit, she was exercising by walking and dancing >> not recently as she was busy. Also, relaxed her diet after her daughter's wedding in 10/2021. She feels tired - working long hours.  After the first of the year, she will return to work in person.  Reviewed history: She started to have some  weight loss and bone pain in 2018 >> presented to see PCP  - TSH was undetectable. She was sent to a rheumatologist for suspicion for RA >> testing was negative.  No intervention was suggested for her abnormal thyroid tests then.  In 10/2017, she started to have: - palpitations - whooshing sound in year - lost weight (also very stressed as she is taking care of her father who has dementia) - tremors - anxiety - insomnia  She presented to see her PCP (Dr. Leavy Murray) >> TSH was again undetectable.  Again, no intervention was suggested.  She then saw Dr. Loreta Murray for a screening colonoscopy >> Dr. Loreta Murray referred her to me urgently for her thyrotoxicosis.  We initially started: -Methimazole 10 mg 2x a day -Atenolol 25 mg daily She did not return for repeat TFTs 1 month after starting the medication (miscommunication?).  However, when she returned to the clinic for another she was telling me that she felt much better.  She gained weight, did not have palpitations or tremors.  At that time, her TSH was very high, at 46.86.  We decrease the methimazole to 5 mg daily and ended up stopping it completely in 06/2018.  At that time, we also started to taper down atenolol and then stop completely.  Subsequent TFTs were normal in  07/2018, however, she called [place] that she started to have hyperthyroid symptoms again and we restarted a low-dose methimazole, 5 mg daily.  However, in 09/2018, her TSH was again high, at 15.86 I will stop methimazole.  She did not return for repeat labs, and presented to the emergency room in 01/2019 with tachycardia, palpitations, and the TSH was found to be undetectable.  We restarted methimazole, at 5 mg twice a day.  She called [place] in 08/2018 for diplopia and I directed her to her ophthalmologist.  She actually saw a optometrist who noticed an abnormality in her gaze and told her that this is possibly related to the thyroid condition, but did not suggest any other follow-up or treatment.  I referred her to Dr. Toni Murray and she did establish care with her.  She initialy had diplopia especially when looking up, then much better on diclofenac - now stopped.  02/2019: Reduced methimazole dose to 5 mg daily  03/2019: We stopped methimazole as TSH was elevated.  07/2019: We restarted methimazole 5 mg daily  11/2019: Stopped methimazole as TSH was again elevated.  01/2020: Restarted MMI 2.5 mg daily  03/2020: Developed headaches and heart rates in the 120s to 130s  04/2020: Started atenolol 25 mg prn by PCP   I reviewed her TFTs: Lab Results  Component Value Date   TSH 1.07 08/19/2021   TSH 1.94 02/18/2021   TSH 0.93 08/13/2020   TSH 0.46 06/30/2020   TSH <0.01 (L) 03/26/2020  TSH <0.01 (L) 02/12/2020   TSH 11.12 (H) 12/19/2019   TSH 3.63 11/16/2019   TSH 0.01 (L) 09/27/2019   TSH <0.01 (L) 08/15/2019   FREET4 0.79 08/19/2021   FREET4 0.76 02/18/2021   FREET4 0.70 08/13/2020   FREET4 0.64 06/30/2020   FREET4 0.70 03/26/2020   FREET4 1.15 02/12/2020   FREET4 0.56 (L) 12/19/2019   FREET4 0.64 11/16/2019   FREET4 0.67 09/27/2019   FREET4 1.32 08/15/2019   T3FREE 3.7 08/19/2021   T3FREE 3.6 02/18/2021   T3FREE 2.9 08/13/2020   T3FREE 3.0 06/30/2020   T3FREE 3.1 03/26/2020    T3FREE 4.3 (H) 02/12/2020   T3FREE 3.2 12/19/2019   T3FREE 3.1 11/16/2019   T3FREE 3.2 09/27/2019   T3FREE 4.6 (H) 08/15/2019  04/05/2020: TSH <0.01 02/08/2018: TSH <0.010 12/15/2016: TSH <0.010 12/18/2015: TSH 2.192  TSI's are elevated: Lab Results  Component Value Date   TSI 471 (H) 08/19/2021   TSI 571 (H) 02/18/2021   TSI 450 (H) 08/15/2019   TSI 389 (H) 10/09/2018   TSI 504 (H) 02/24/2018   Pt denies: - feeling nodules in neck - hoarseness - dysphagia - choking  No FH of thyroid disease or cancer. No h/o radiation tx to head or neck. No herbal supplements. No Biotin use. No recent steroids use.   Vitamin deficiency   Reviewed vitamin D levels: 02/18/2021: vitamin D 50 Lab Results  Component Value Date   VD25OH 57.7 08/13/2020   VD25OH 52.1 08/15/2019   VD25OH 36.48 02/27/2019   VD25OH 28.27 (L) 10/09/2018  - level of 14 on 02/08/2018  - level of 12 on 12/18/2015  She is on 2000 units vitamin D daily.    She also has a history of anemia.  ROS:+ see HPI She has palpitations when stressed.  I reviewed pt's medications, allergies, PMH, social hx, family hx, and changes were documented in the history of present illness. Otherwise, unchanged from my initial visit note.  PMH: -See above  No past surgical history  Social History   Socioeconomic History   Marital status: Married    Spouse name: Not on file   Number of children: 0   Years of education: Not on file   Highest education level: Not on file  Occupational History   Director of Human Resources  Tobacco Use   Smoking status: Never Smoker   Smokeless tobacco: Never Used  Substance and Sexual Activity   Alcohol use: No   Drug use: No   Current Outpatient Medications  Medication Sig Dispense Refill   atenolol (TENORMIN) 25 MG tablet Take 1 tablet (25 mg total) by mouth daily. (Patient not taking: Reported on 04/29/2021) 90 tablet 3   calcium-vitamin D (OSCAL WITH D) 500-200 MG-UNIT tablet Take 1  mcg by mouth daily. (Patient not taking: Reported on 04/29/2021)     Cholecalciferol (VITAMIN D3) 25 MCG (1000 UT) CAPS Take by mouth. Take 1 tablet by mouth three times weekly.     Cyanocobalamin (VITAMIN B 12 PO) Take 1 mg by mouth daily.     Ferrous Sulfate (SLOW FE PO)  (Patient not taking: Reported on 04/29/2021)     ibuprofen (ADVIL) 600 MG tablet Take 600 mg by mouth daily.     methimazole (TAPAZOLE) 5 MG tablet Take 0.5 tablets (2.5 mg total) by mouth daily. 90 tablet 1   methocarbamol (ROBAXIN) 500 MG tablet Take 500 mg by mouth daily. (Patient not taking: Reported on 08/19/2021)     Omega-3 Fatty Acids (  FISH OIL) 1000 MG CAPS Take by mouth.     riboflavin (VITAMIN B-2) 100 MG TABS tablet Take 1 tablet by mouth daily.     Selenium 200 MCG CAPS Take 1 tablet by mouth 2 (two) times daily.     No current facility-administered medications for this visit.    Not on File   Family History  Problem Relation Age of Onset   Stroke Mother    Heart disease Mother    Alzheimer's disease Father    Colon cancer Neg Hx    Colon polyps Neg Hx    Esophageal cancer Neg Hx    Stomach cancer Neg Hx    Rectal cancer Neg Hx     Mother  - heart ds Father  - Prostate cancer  PE: BP (!) 144/92 (BP Location: Left Arm, Patient Position: Sitting, Cuff Size: Normal)   Pulse (!) 101   Ht 5\' 4"  (1.626 m)   Wt 143 lb 3.2 oz (65 kg)   SpO2 99%   BMI 24.58 kg/m  Wt Readings from Last 3 Encounters:  03/19/22 143 lb 3.2 oz (65 kg)  08/19/21 142 lb 12.8 oz (64.8 kg)  05/13/21 141 lb (64 kg)   Constitutional: normal weight, in NAD Eyes: EOMI, + mild exophthalmos, + stare ENT:  no thyromegaly, no cervical lymphadenopathy Cardiovascular: Tachycardia, RR, No RG, +1/6 SEM Respiratory: CTA B Musculoskeletal: no deformities Skin:no rashes Neurological: no tremor with outstretched hands  ASSESSMENT: 1. Graves ds.  2.  Diplopia  3. Vit D deficiency  PLAN:  1. Patient with history of low TSH,  confirmed as Graves' disease, based on high TSI antibodies.  She initially had thyrotoxic symptoms including weight loss, palpitations, tremors, anxiety, which resolved after starting methimazole.  We discussed in the past about possible consequences of uncontrolled thyrotoxicosis to include arrhythmia, heart failure, coagulability with the risk of stroke, PE, AMI, sudden death.  We initially started methimazole and atenolol, however, she did not return for labs and the next TSH returned very high, at 47.  At that time, we decreased her methimazole dose and ended up stopping methimazole completely in 06/2018.  However, afterwards, she had a return of thyrotoxicosis symptoms and we had to restart methimazole 5 mg daily.  In 09/2018, we again stopped methimazole as her TSH returned 16.  She presented to the emergency room with tachycardia and palpitations in 01/2019 and methimazole was restarted at that time.  A subsequent TSH was again high in 11/2019 so we stopped methimazole again.  We restarted methimazole 2.5 mg daily in 01/2020.  In 07/2020, TFTs were normal so we continued the same dose of methimazole.  2 subsequent sets of thyroid hormones were normal afterwards, including at last visit, in 07/2021. -Due to variability in her TFTs and inability to regulate her thyroid status, I strongly suggested definitive treatment for her Luiz Blare' disease in the past in the form of RAI treatment or surgery.  We discussed that this could be done with transaxillary approach.  Due to the mild exophthalmos, she is probably a better candidate for surgery rather than RAI.  However, she declined both. -For her exophthalmos with diplopia with upward gaze, I referred her to Dr. Ralene Muskrat at [facility].  She was started on diclofenac and this helped.  She was able to come off afterwards.  Solu-Medrol and Alveda Reasons were considered but were not needed.  She continues on selenium.  She sees Dr. Toni Murray regularly.   -Of note, latest  TSIs were still elevated, but lower.  I advised her to let Dr. Toni Murray know about them. -She was able to come off atenolol in the past but she restarted it due to high heart rate and palpitations.  At last visit she mentioned that she has some palpitations when stressed.  She is monitoring her heart rate with her smart watch and her pulse was occasionally over 90 at rest, while he was up to 140s after exercise.  I recommended to restart atenolol. She takes this as needed.  I refilled her prescription today.  Her pulse is high today but she tells me she rushed over here. -At today's visit, she has no hyperthyroid symptoms no tremors, increase in palpitations or unintentional weight loss.  In fact, she would like to lose 5-7 more pounds. -We will recheck her TFTs today and if we need to decrease the dose of methimazole we have to do a very low taper -Will not repeat her TSI is now -I will see her back in 1 year, but in 6 months for labs  2.  Vit D def -She continues on 2000 units vitamin D daily -no generalized joint pains -Vitamin D levels were normal recently: 02/18/2021: vitamin D 50 Lab Results  Component Value Date   VD25OH 57.7 08/13/2020   VD25OH 52.1 08/15/2019   VD25OH 36.48 02/27/2019   VD25OH 28.27 (L) 10/09/2018  -We will repeat the level today  Carlus Pavlov, MD PhD [facility] Endocrinology

## 2022-03-20 LAB — T4, FREE: Free T4: 0.8 ng/dL (ref 0.8–1.8)

## 2022-03-20 LAB — TSH: TSH: 6.95 mIU/L — ABNORMAL HIGH (ref 0.40–4.50)

## 2022-03-20 LAB — VITAMIN D 25 HYDROXY (VIT D DEFICIENCY, FRACTURES): Vit D, 25-Hydroxy: 44 ng/mL (ref 30–100)

## 2022-03-20 LAB — T3, FREE: T3, Free: 3 pg/mL (ref 2.3–4.2)

## 2022-03-23 ENCOUNTER — Encounter: Payer: Self-pay | Admitting: Internal Medicine

## 2022-03-23 MED ORDER — METHIMAZOLE 5 MG PO TABS: 2.5000 mg | ORAL_TABLET | ORAL | 1 refills | Status: DC

## 2022-03-29 ENCOUNTER — Ambulatory Visit: Payer: No Typology Code available for payment source

## 2022-03-29 DIAGNOSIS — I6523 Occlusion and stenosis of bilateral carotid arteries: Secondary | ICD-10-CM

## 2022-04-05 NOTE — Progress Notes (Signed)
Called patient and left a voicemail to remind her of her appt tomorrow and that results would also be reviewed at this appt.

## 2022-04-06 ENCOUNTER — Ambulatory Visit: Payer: No Typology Code available for payment source | Admitting: Cardiology

## 2022-04-06 ENCOUNTER — Encounter: Payer: Self-pay | Admitting: Cardiology

## 2022-04-06 VITALS — BP 163/83 | HR 88 | Resp 16 | Ht 64.0 in | Wt 143.6 lb

## 2022-04-06 DIAGNOSIS — I6523 Occlusion and stenosis of bilateral carotid arteries: Secondary | ICD-10-CM

## 2022-04-06 DIAGNOSIS — E0500 Thyrotoxicosis with diffuse goiter without thyrotoxic crisis or storm: Secondary | ICD-10-CM

## 2022-04-06 DIAGNOSIS — I4729 Other ventricular tachycardia: Secondary | ICD-10-CM

## 2022-04-06 DIAGNOSIS — I351 Nonrheumatic aortic (valve) insufficiency: Secondary | ICD-10-CM

## 2022-04-06 DIAGNOSIS — I340 Nonrheumatic mitral (valve) insufficiency: Secondary | ICD-10-CM

## 2022-04-06 NOTE — Progress Notes (Signed)
Date:  04/06/2022   ID:  Sandra Murray, DOB 12/07/1960, MRN [id number]  PCP:  Harvest Forest, MD  Cardiologist: Tessa Lerner, DO, [facility] (established care 04/22/2020) Former Cardiology Providers: Dr. Wyline Mood.   Date: 04/06/22 Last Office Visit: 07/03/2020  Chief Complaint  Patient presents with   carotid disease   Follow-up    1 year & review carotid results    HPI  Sandra Murray is a 62 y.o. female whose past medical history and cardiovascular risk factors include: Graves disease and hx of bilateral asymptomatic carotid artery stenosis.  In the past she was noted to have carotid bruits and therefore underwent carotid duplex which noted bilateral lateral stenosis which is being monitored via carotid duplex and recommended to be on aspirin and statin therapy.  Patient refused to be on pharmacological therapy and implemented lifestyle changes and the most recent carotid duplex notes a significant improvement in overall disease burden.  Her thyroid disease is currently being managed by endocrinology and her dose of methimazole is being titrated.  She is no longer taking atenolol for palpitations as she is asymptomatic.  Patient blood pressure at today's office visit is not well-controlled.  Patient states that she has noticed that at multiple office visits and thinks is likely due to whitecoat hypertension.  She is working with PCP on keeping a log of blood pressures at home and to consider pharmacological therapy if needed.  FUNCTIONAL STATUS: No structured exercise program or daily routine.   ALLERGIES: No Known Allergies  MEDICATION LIST PRIOR TO VISIT: Current Meds  Medication Sig   calcium-vitamin D (OSCAL WITH D) 500-200 MG-UNIT tablet Take 1 mcg by mouth daily.   Cholecalciferol (VITAMIN D3) 25 MCG (1000 UT) CAPS Take by mouth. Take 1 tablet by mouth three times weekly.   Cyanocobalamin (VITAMIN B 12 PO) Take 1 mg by mouth daily.   methimazole (TAPAZOLE) 5 MG  tablet Take 0.5 tablets (2.5 mg total) by mouth every other day.   Omega-3 Fatty Acids (FISH OIL) 1000 MG CAPS Take by mouth.   riboflavin (VITAMIN B-2) 100 MG TABS tablet Take 1 tablet by mouth daily.   Selenium 200 MCG CAPS Take 1 tablet by mouth 2 (two) times daily.     PAST MEDICAL HISTORY: Past Medical History:  Diagnosis Date   Anemia    Carotid artery stenosis, asymptomatic, bilateral    Graves disease     PAST SURGICAL HISTORY: History reviewed. No pertinent surgical history.  FAMILY HISTORY: The patient family history includes Alzheimer's disease in her father; Heart disease in her mother; Stroke in her mother.  SOCIAL HISTORY:  The patient  reports that she has never smoked. She has never used smokeless tobacco. She reports that she does not drink alcohol and does not use drugs.  REVIEW OF SYSTEMS: Review of Systems  Cardiovascular:  Negative for chest pain, claudication, dyspnea on exertion, irregular heartbeat, leg swelling, near-syncope, orthopnea, palpitations, paroxysmal nocturnal dyspnea and syncope.  Respiratory:  Negative for shortness of breath.   Hematologic/Lymphatic: Negative for bleeding problem.  Musculoskeletal:  Negative for muscle cramps and myalgias.  Neurological:  Negative for dizziness and light-headedness.   PHYSICAL EXAM:    04/06/2022    1:30 PM 03/19/2022    3:38 PM 08/19/2021    9:52 AM  Vitals with BMI  Height 5\' 4"  5\' 4"  5\' 4"   Weight 143 lbs 10 oz 143 lbs 3 oz 142 lbs 13 oz  BMI 24.64 24.57 24.5  Systolic 163  144 114  Diastolic 83 92 70  Pulse 88 101 74   Physical Exam  Constitutional: No distress.  Age appropriate, hemodynamically stable.   Neck: No JVD present.  Cardiovascular: Normal rate, regular rhythm, S1 normal, S2 normal, intact distal pulses and normal pulses. Exam reveals no gallop, no S3 and no S4.  No murmur heard. Pulses:      Carotid pulses are  on the right side with bruit and  on the left side with  bruit. Pulmonary/Chest: Effort normal and breath sounds normal. No stridor. She has no wheezes. She has no rales.  Abdominal: Soft. Bowel sounds are normal. She exhibits no distension. There is no abdominal tenderness.  Musculoskeletal:        General: No edema.     Cervical back: Neck supple.  Neurological: She is alert and oriented to person, place, and time. She has intact cranial nerves (2-12).  Skin: Skin is warm and moist.   CARDIAC DATABASE: EKG: 04/28/2020: Sinus  Rhythm, 76bpm, normal axis, without underlying injury pattern.  Echocardiogram: 04/28/2020:  Left ventricle cavity is normal in size. Mild concentric hypertrophy of the left ventricle. Normal global wall motion. Normal LV systolic function with EF 61%. Normal diastolic filling pattern.  Structurally normal trileaflet aortic valve. No evidence of aortic stenosis. Moderate (Grade II) aortic regurgitation.  Structurally normal mitral valve. No evidence of mitral stenosis. Moderate (Grade II) mitral regurgitation.  No evidence of pulmonary hypertension.  Stress Testing: 06/30/2020: Exercise treadmill stress test performed using Bruce protocol.  Patient reached 9.3 METS, and 104% of age predicted maximum heart rate.   Exercise capacity was good.  No chest pain reported.   Normal heart rate response.  Resting hypertension with hypertensive stress response, peak BP 206/74 mmHg. Peak ECG demonstrated sinus tachycardia, 1.5-2 mm upsloping ST depression in inferolateral leads that return to normal within 1 into recovery. These changes are equivocal for ischemia.  Intermediate risk study.  Heart Catheterization: None  14 day mobile cardiac ambulatory telemetry:  Dominant rhythm normal sinus rhythm. Heart rate 59-159 bpm.  Avg HR 88 bpm. No atrial fibrillation, supraventricular tachycardia, high grade AV block, pauses (3 seconds or longer). One episode of nonsustained ventricular tachycardia, 5 beats in duration, average heart  rate of 150 bpm, for 2.2 seconds at 5:11 AM on 05/08/2020. Total ventricular ectopic burden <1%. Total supraventricular ectopic burden <1%. Patient triggered events: 0.  Carotid artery duplex 05/08/2020:  Stenosis in the right internal carotid artery (16-49%).  Stenosis in the left internal carotid artery (50-69%).  There is homogenous plaque in bilateral carotid arteries.  Antegrade right vertebral artery flow. Antegrade left vertebral artery flow.  Follow up in six months is appropriate if clinically indicated.  Carotid artery duplex 03/29/2022: Duplex suggests stenosis in the right internal carotid artery (minimal). Duplex suggests stenosis in the left internal carotid artery (minimal). Antegrade right vertebral artery flow. Antegrade left vertebral artery flow.  LABORATORY DATA:    Latest Ref Rng & Units 01/27/2019   12:43 PM 01/26/2019   12:45 PM  CBC  WBC 4.0 - 10.5 K/uL 7.5  8.6   Hemoglobin 12.0 - 15.0 g/dL 85.2  77.8   Hematocrit 36.0 - 46.0 % 35.2  36.4   Platelets 150 - 400 K/uL 264  257        Latest Ref Rng & Units 01/27/2019   12:43 PM 01/26/2019   12:45 PM  CMP  Glucose 70 - 99 mg/dL 88  79   BUN 6 -  20 mg/dL 11  12   Creatinine 1.61 - 1.00 mg/dL 0.96  0.45   Sodium 409 - 145 mmol/L 140  137   Potassium 3.5 - 5.1 mmol/L 3.3  3.7   Chloride 98 - 111 mmol/L 107  102   CO2 22 - 32 mmol/L 22  21   Calcium 8.9 - 10.3 mg/dL 9.6  9.5     Lipid Panel  No results found for: "CHOL", "TRIG", "HDL", "CHOLHDL", "VLDL", "LDLCALC", "LDLDIRECT", "LABVLDL"  No components found for: "NTPROBNP" No results for input(s): "PROBNP" in the last 8760 hours. Recent Labs    08/19/21 1006 03/19/22 1602  TSH 1.07 6.95*    BMP No results for input(s): "NA", "K", "CL", "CO2", "GLUCOSE", "BUN", "CREATININE", "CALCIUM", "GFRNONAA", "GFRAA" in the last 8760 hours.  HEMOGLOBIN A1C Lab Results  Component Value Date   HGBA1C 11.0 02/08/2018   External Labs: Collected: 03/20/2020  at [facility] Creatinine 0.66 mg/dL. eGFR: >60 mL/min per 1.73 m Lipid profile: Total cholesterol 165, triglycerides 60, HDL 53, LDL 100 Hemoglobin 10.8, hematocrit 33.9 TSH: <0.01   IMPRESSION:    ICD-10-CM   1. Atherosclerosis of both carotid arteries  I65.23 EKG 12-Lead    2. NSVT (nonsustained ventricular tachycardia) (HCC)  I47.29 EKG 12-Lead    3. Graves disease  E05.00     4. Moderate aortic regurgitation  I35.1 EKG 12-Lead    5. Moderate mitral regurgitation  I34.0 EKG 12-Lead       RECOMMENDATIONS: Sherren Lemos is a 62 y.o. female whose past medical history and cardiac risk factors include:  Graves disease, bilateral asymptomatic carotid artery stenosis.  Atherosclerosis of both carotid arteries Most recent carotid duplex notes significant improvement in overall disease burden.   She has very minimal plaque bilaterally on current study (likely due to life style changes).  On physical examination still able to appreciate a degree of carotid bruit. Shared decision was to proceed with a repeat carotid duplex for confirmation in 2 years.   NSVT (nonsustained ventricular tachycardia) (HCC) Graves disease Palpitations Given her underlying Graves' disease she used to have palpitations in the past.  Her thyroid function has improved and no longer experiences palpitation.  Her methimazole dose was also been titrated by her endocrinologist.  During the workup of palpitations she was noted to have NSVT which were overall asymptomatic but was started on beta-blockers at that time.  She no longer is taking beta-blockers as she is asymptomatic.  Will continue to monitor.  Moderate aortic regurgitation Moderate mitral regurgitation Noted incidentally on an echocardiogram back in February 2022. I would recommend a repeat echocardiogram to reevaluate valvular heart disease.  Prior to doing so I believe she should address her elevated blood pressure  readings.  She is working with PCP regarding this and will call us back to schedule her echo once her blood pressures are better controlled.   FINAL MEDICATION LIST END OF ENCOUNTER: No orders of the defined types were placed in this encounter.   Medications Discontinued During This Encounter  Medication Reason   ibuprofen (ADVIL) 600 MG tablet Patient Preference   atenolol (TENORMIN) 25 MG tablet Discontinued by provider   Ferrous Sulfate (SLOW FE PO) Patient Preference   methocarbamol (ROBAXIN) 500 MG tablet Entry Error     Current Outpatient Medications:    calcium-vitamin D (OSCAL WITH D) 500-200 MG-UNIT tablet, Take 1 mcg by mouth daily., Disp: , Rfl:    Cholecalciferol (VITAMIN D3) 25 MCG (1000 UT)  CAPS, Take by mouth. Take 1 tablet by mouth three times weekly., Disp: , Rfl:    Cyanocobalamin (VITAMIN B 12 PO), Take 1 mg by mouth daily., Disp: , Rfl:    methimazole (TAPAZOLE) 5 MG tablet, Take 0.5 tablets (2.5 mg total) by mouth every other day., Disp: 30 tablet, Rfl: 1   Omega-3 Fatty Acids (FISH OIL) 1000 MG CAPS, Take by mouth., Disp: , Rfl:    riboflavin (VITAMIN B-2) 100 MG TABS tablet, Take 1 tablet by mouth daily., Disp: , Rfl:    Selenium 200 MCG CAPS, Take 1 tablet by mouth 2 (two) times daily., Disp: , Rfl:   Orders Placed This Encounter  Procedures   EKG 12-Lead     There are no Patient Instructions on file for this visit.  --Continue cardiac medications as reconciled in final medication list. --Return in about 1 year (around 04/07/2023) for Annual follow up visit. Or sooner if needed. --Continue follow-up with your primary care physician regarding the management of your other chronic comorbid conditions.  Patient's questions and concerns were addressed to her satisfaction. She voices understanding of the instructions provided during this encounter.   This note was created using a voice recognition software as a result there may be grammatical errors inadvertently  enclosed that do not reflect the nature of this encounter. Every attempt is made to correct such errors.  Tessa Lerner, [place], [facility]  Pager: [phone] Office: [phone]

## 2022-04-15 ENCOUNTER — Encounter: Payer: Self-pay | Admitting: Internal Medicine

## 2022-04-16 ENCOUNTER — Other Ambulatory Visit: Payer: BC Managed Care – PPO

## 2022-04-20 ENCOUNTER — Other Ambulatory Visit (INDEPENDENT_AMBULATORY_CARE_PROVIDER_SITE_OTHER): Payer: No Typology Code available for payment source

## 2022-04-20 DIAGNOSIS — E0500 Thyrotoxicosis with diffuse goiter without thyrotoxic crisis or storm: Secondary | ICD-10-CM

## 2022-04-20 LAB — T4, FREE: Free T4: 0.66 ng/dL (ref 0.60–1.60)

## 2022-04-20 LAB — T3, FREE: T3, Free: 3.4 pg/mL (ref 2.3–4.2)

## 2022-04-20 LAB — TSH: TSH: 3.96 u[IU]/mL (ref 0.35–5.50)

## 2022-06-21 ENCOUNTER — Other Ambulatory Visit: Payer: BC Managed Care – PPO

## 2022-08-30 ENCOUNTER — Other Ambulatory Visit (INDEPENDENT_AMBULATORY_CARE_PROVIDER_SITE_OTHER): Payer: No Typology Code available for payment source

## 2022-08-30 DIAGNOSIS — E0500 Thyrotoxicosis with diffuse goiter without thyrotoxic crisis or storm: Secondary | ICD-10-CM

## 2022-08-30 LAB — T4, FREE: Free T4: 0.6 ng/dL (ref 0.60–1.60)

## 2022-08-30 LAB — TSH: TSH: 1.78 u[IU]/mL (ref 0.35–5.50)

## 2022-08-30 LAB — T3, FREE: T3, Free: 2.8 pg/mL (ref 2.3–4.2)

## 2022-09-08 ENCOUNTER — Telehealth: Payer: Self-pay | Admitting: Internal Medicine

## 2022-09-08 NOTE — Telephone Encounter (Signed)
Patient called and is requesting  a TSI lab be run. She states that her eye doctor is requesting that be done for her.  Call back  # [phone]

## 2022-09-09 ENCOUNTER — Other Ambulatory Visit: Payer: Self-pay

## 2022-09-09 DIAGNOSIS — E0500 Thyrotoxicosis with diffuse goiter without thyrotoxic crisis or storm: Secondary | ICD-10-CM

## 2022-09-09 NOTE — Telephone Encounter (Signed)
Dr Elvera Lennox is it ok to order this lab for [facility], please advise?

## 2022-09-09 NOTE — Telephone Encounter (Signed)
Noted, TSI has been ordered and patient is aware

## 2022-09-11 ENCOUNTER — Other Ambulatory Visit: Payer: Self-pay | Admitting: Internal Medicine

## 2022-09-13 ENCOUNTER — Other Ambulatory Visit: Payer: No Typology Code available for payment source

## 2022-09-13 ENCOUNTER — Other Ambulatory Visit: Payer: BC Managed Care – PPO

## 2022-09-13 DIAGNOSIS — R3129 Other microscopic hematuria: Secondary | ICD-10-CM

## 2022-09-13 DIAGNOSIS — E0500 Thyrotoxicosis with diffuse goiter without thyrotoxic crisis or storm: Secondary | ICD-10-CM

## 2022-09-15 LAB — THYROID STIMULATING IMMUNOGLOBULIN: TSI: 389 % baseline — ABNORMAL HIGH (ref <140)

## 2022-09-17 ENCOUNTER — Encounter: Payer: Self-pay | Admitting: Internal Medicine

## 2022-10-21 ENCOUNTER — Other Ambulatory Visit (HOSPITAL_BASED_OUTPATIENT_CLINIC_OR_DEPARTMENT_OTHER): Payer: Self-pay | Admitting: Internal Medicine

## 2022-10-21 ENCOUNTER — Ambulatory Visit (HOSPITAL_BASED_OUTPATIENT_CLINIC_OR_DEPARTMENT_OTHER)
Admission: RE | Admit: 2022-10-21 | Discharge: 2022-10-21 | Disposition: A | Discharge status: Home / Self Care | Payer: No Typology Code available for payment source | Source: Ambulatory Visit | Attending: Internal Medicine | Admitting: Internal Medicine

## 2022-10-21 DIAGNOSIS — M79661 Pain in right lower leg: Secondary | ICD-10-CM | POA: Insufficient documentation

## 2022-10-26 ENCOUNTER — Other Ambulatory Visit: Payer: No Typology Code available for payment source

## 2022-10-26 ENCOUNTER — Ambulatory Visit (HOSPITAL_BASED_OUTPATIENT_CLINIC_OR_DEPARTMENT_OTHER)
Admission: RE | Admit: 2022-10-26 | Discharge: 2022-10-26 | Disposition: A | Discharge status: Home / Self Care | Payer: No Typology Code available for payment source | Source: Ambulatory Visit | Attending: Internal Medicine | Admitting: Internal Medicine

## 2022-10-26 ENCOUNTER — Other Ambulatory Visit (HOSPITAL_BASED_OUTPATIENT_CLINIC_OR_DEPARTMENT_OTHER): Payer: Self-pay | Admitting: Internal Medicine

## 2022-10-26 DIAGNOSIS — M79661 Pain in right lower leg: Secondary | ICD-10-CM

## 2022-11-04 ENCOUNTER — Other Ambulatory Visit: Payer: Self-pay | Admitting: Internal Medicine

## 2023-01-16 ENCOUNTER — Emergency Department (HOSPITAL_BASED_OUTPATIENT_CLINIC_OR_DEPARTMENT_OTHER): Payer: No Typology Code available for payment source

## 2023-01-16 ENCOUNTER — Emergency Department (HOSPITAL_BASED_OUTPATIENT_CLINIC_OR_DEPARTMENT_OTHER)
Admission: EM | Admit: 2023-01-16 | Discharge: 2023-01-16 | Disposition: A | Discharge status: Home / Self Care | Payer: No Typology Code available for payment source | Attending: Emergency Medicine | Admitting: Emergency Medicine

## 2023-01-16 ENCOUNTER — Encounter (HOSPITAL_BASED_OUTPATIENT_CLINIC_OR_DEPARTMENT_OTHER): Payer: Self-pay

## 2023-01-16 DIAGNOSIS — N132 Hydronephrosis with renal and ureteral calculous obstruction: Secondary | ICD-10-CM | POA: Insufficient documentation

## 2023-01-16 DIAGNOSIS — N200 Calculus of kidney: Secondary | ICD-10-CM

## 2023-01-16 DIAGNOSIS — R112 Nausea with vomiting, unspecified: Secondary | ICD-10-CM | POA: Diagnosis present

## 2023-01-16 LAB — COMPREHENSIVE METABOLIC PANEL
ALT: 14 U/L (ref 0–44)
AST: 27 U/L (ref 15–41)
Albumin: 4.1 g/dL (ref 3.5–5.0)
Alkaline Phosphatase: 84 U/L (ref 38–126)
Anion gap: 14 (ref 5–15)
BUN: 11 mg/dL (ref 8–23)
CO2: 21 mmol/L — ABNORMAL LOW (ref 22–32)
Calcium: 9.2 mg/dL (ref 8.9–10.3)
Chloride: 105 mmol/L (ref 98–111)
Creatinine, Ser: 0.96 mg/dL (ref 0.44–1.00)
GFR, Estimated: >60 mL/min (ref >60)
Glucose, Bld: 116 mg/dL — ABNORMAL HIGH (ref 70–99)
Potassium: 3.7 mmol/L (ref 3.5–5.1)
Sodium: 140 mmol/L (ref 135–145)
Total Bilirubin: 0.6 mg/dL (ref 0.3–1.2)
Total Protein: 7.7 g/dL (ref 6.5–8.1)

## 2023-01-16 LAB — CBC WITH DIFFERENTIAL/PLATELET
Abs Immature Granulocytes: 0.03 10*3/uL (ref 0.00–0.07)
Basophils Absolute: 0.1 10*3/uL (ref 0.0–0.1)
Basophils Relative: 1 %
Eosinophils Absolute: 0.1 10*3/uL (ref 0.0–0.5)
Eosinophils Relative: 1 %
HCT: 36.7 % (ref 36.0–46.0)
Hemoglobin: 11.4 g/dL — ABNORMAL LOW (ref 12.0–15.0)
Immature Granulocytes: 0 %
Lymphocytes Relative: 11 %
Lymphs Abs: 1.3 10*3/uL (ref 0.7–4.0)
MCH: 24.4 pg — ABNORMAL LOW (ref 26.0–34.0)
MCHC: 31.1 g/dL (ref 30.0–36.0)
MCV: 78.6 fL — ABNORMAL LOW (ref 80.0–100.0)
Monocytes Absolute: 0.6 10*3/uL (ref 0.1–1.0)
Monocytes Relative: 5 %
Neutro Abs: 10.3 10*3/uL — ABNORMAL HIGH (ref 1.7–7.7)
Neutrophils Relative %: 82 %
Platelets: 255 10*3/uL (ref 150–400)
RBC: 4.67 MIL/uL (ref 3.87–5.11)
RDW: 13.9 % (ref 11.5–15.5)
WBC: 12.3 10*3/uL — ABNORMAL HIGH (ref 4.0–10.5)
nRBC: 0 % (ref 0.0–0.2)

## 2023-01-16 LAB — URINALYSIS, ROUTINE W REFLEX MICROSCOPIC
Bilirubin Urine: NEGATIVE
Glucose, UA: NEGATIVE mg/dL
Hgb urine dipstick: NEGATIVE
Ketones, ur: NEGATIVE mg/dL
Leukocytes,Ua: NEGATIVE
Nitrite: NEGATIVE
Protein, ur: NEGATIVE mg/dL
Specific Gravity, Urine: 1.02 (ref 1.005–1.030)
pH: 8.5 — ABNORMAL HIGH (ref 5.0–8.0)

## 2023-01-16 LAB — LIPASE, BLOOD: Lipase: 29 U/L (ref 11–51)

## 2023-01-16 MED ORDER — TAMSULOSIN HCL 0.4 MG PO CAPS: 0.4000 mg | ORAL_CAPSULE | Freq: Every day | ORAL | 0 refills | Status: DC

## 2023-01-16 MED ORDER — MORPHINE SULFATE (PF) 4 MG/ML IV SOLN
4.0000 mg | Freq: Once | INTRAVENOUS | Status: DC
  Filled 2023-01-16: qty 1

## 2023-01-16 MED ORDER — ONDANSETRON HCL 4 MG/2ML IJ SOLN
4.0000 mg | Freq: Once | INTRAMUSCULAR | Status: AC
  Administered 2023-01-16: 4 mg via INTRAMUSCULAR

## 2023-01-16 MED ORDER — KETOROLAC TROMETHAMINE 30 MG/ML IJ SOLN
30.0000 mg | Freq: Once | INTRAMUSCULAR | Status: DC
  Filled 2023-01-16: qty 1

## 2023-01-16 MED ORDER — MORPHINE SULFATE (PF) 4 MG/ML IV SOLN
4.0000 mg | Freq: Once | INTRAVENOUS | Status: AC
  Administered 2023-01-16: 4 mg via INTRAMUSCULAR

## 2023-01-16 MED ORDER — KETOROLAC TROMETHAMINE 30 MG/ML IJ SOLN
30.0000 mg | Freq: Once | INTRAMUSCULAR | Status: AC
  Administered 2023-01-16: 30 mg via INTRAMUSCULAR

## 2023-01-16 MED ORDER — OXYCODONE-ACETAMINOPHEN 5-325 MG PO TABS
1.0000 | ORAL_TABLET | Freq: Four times a day (QID) | ORAL | 0 refills | Status: DC | PRN
Start: 2023-01-16 — End: 2023-01-21

## 2023-01-16 MED ORDER — MORPHINE SULFATE (PF) 4 MG/ML IV SOLN
4.0000 mg | Freq: Once | INTRAVENOUS | Status: AC
  Administered 2023-01-16: 4 mg via INTRAVENOUS
  Filled 2023-01-16: qty 1

## 2023-01-16 MED ORDER — SODIUM CHLORIDE 0.9 % IV BOLUS
1000.0000 mL | Freq: Once | INTRAVENOUS | Status: AC
  Administered 2023-01-16: 1000 mL via INTRAVENOUS

## 2023-01-16 MED ORDER — ONDANSETRON 4 MG PO TBDP: 4.0000 mg | ORAL_TABLET | Freq: Three times a day (TID) | ORAL | 0 refills | Status: DC | PRN

## 2023-01-16 MED ORDER — ONDANSETRON HCL 4 MG/2ML IJ SOLN
4.0000 mg | Freq: Once | INTRAMUSCULAR | Status: DC
  Filled 2023-01-16: qty 2

## 2023-01-16 NOTE — ED Triage Notes (Signed)
Pt reports woken up 2 hrs ago with right side flank pain and vomiting

## 2023-01-16 NOTE — ED Notes (Signed)
Attempted IV access to LAC, LFA, unsuccessful.

## 2023-01-16 NOTE — ED Provider Notes (Signed)
[place] [facility] Provider Note   CSN: [id number] Arrival date & time: 01/16/23  0702     History  Chief Complaint  Patient presents with   Emesis   Flank Pain    Sandra Murray is a 62 y.o. female.  Pt is a 62 yo female with pmhx significant for Grave's disease, anemia, and carotid artery stenosis.  Pt said she woke up about 2 hrs pta with severe right sided abd pain and n/v.  She has never had anything like this in the past.         Home Medications Prior to Admission medications   Medication Sig Start Date End Date Taking? Authorizing Provider  ondansetron (ZOFRAN-ODT) 4 MG disintegrating tablet Take 1 tablet (4 mg total) by mouth every 8 (eight) hours as needed. 01/16/23  Yes Jacalyn Lefevre, MD  oxyCODONE-acetaminophen (PERCOCET/ROXICET) 5-325 MG tablet Take 1 tablet by mouth every 6 (six) hours as needed for severe pain (pain score 7-10). 01/16/23  Yes Jacalyn Lefevre, MD  tamsulosin (FLOMAX) 0.4 MG CAPS capsule Take 1 capsule (0.4 mg total) by mouth daily. 01/16/23  Yes Jacalyn Lefevre, MD  atenolol (TENORMIN) 25 MG tablet TAKE 1 TABLET (25 MG TOTAL) BY MOUTH DAILY. 09/13/22   Carlus Pavlov, MD  calcium-vitamin D (OSCAL WITH D) 500-200 MG-UNIT tablet Take 1 mcg by mouth daily.    [provider]  Cholecalciferol (VITAMIN D3) 25 MCG (1000 UT) CAPS Take by mouth. Take 1 tablet by mouth three times weekly.    [provider]  Cyanocobalamin (VITAMIN B 12 PO) Take 1 mg by mouth daily.    [provider]  methimazole (TAPAZOLE) 5 MG tablet TAKE 1/2 TABLET BY MOUTH DAILY 11/04/22   Carlus Pavlov, MD  Omega-3 Fatty Acids (FISH OIL) 1000 MG CAPS Take by mouth.    [provider]  riboflavin (VITAMIN B-2) 100 MG TABS tablet Take 1 tablet by mouth daily.    [provider]  Selenium 200 MCG CAPS Take 1 tablet by mouth 2 (two) times daily.    [provider]      Allergies     Patient has no known allergies.    Review of Systems   Review of Systems  Gastrointestinal:  Positive for abdominal pain.  All other systems reviewed and are negative.   Physical Exam Updated Vital Signs BP (!) 146/88   Pulse 100   Temp 97.9 F (36.6 C) (Oral)   Resp 18   Wt 62.1 kg   SpO2 100%   BMI 23.52 kg/m  Physical Exam Vitals and nursing note reviewed.  Constitutional:      General: She is in acute distress.     Appearance: Normal appearance.  HENT:     Head: Normocephalic and atraumatic.     Right Ear: External ear normal.     Left Ear: External ear normal.     Nose: Nose normal.     Mouth/Throat:     Mouth: Mucous membranes are moist.     Pharynx: Oropharynx is clear.  Eyes:     Extraocular Movements: Extraocular movements intact.     Conjunctiva/sclera: Conjunctivae normal.     Pupils: Pupils are equal, round, and reactive to light.  Cardiovascular:     Rate and Rhythm: Normal rate and regular rhythm.     Pulses: Normal pulses.     Heart sounds: Normal heart sounds.  Pulmonary:     Effort: Pulmonary effort is normal.  Breath sounds: Normal breath sounds.  Abdominal:     General: Abdomen is flat. Bowel sounds are normal.     Palpations: Abdomen is soft.  Musculoskeletal:        General: Normal range of motion.     Cervical back: Normal range of motion and neck supple.  Skin:    General: Skin is warm.     Capillary Refill: Capillary refill takes less than 2 seconds.  Neurological:     General: No focal deficit present.     Mental Status: She is oriented to person, place, and time.  Psychiatric:        Mood and Affect: Mood normal.        Behavior: Behavior normal.        Thought Content: Thought content normal.        Judgment: Judgment normal.     ED Results / Procedures / Treatments   Labs (all labs ordered are listed, but only abnormal results are displayed) Labs Reviewed  CBC WITH DIFFERENTIAL/PLATELET - Abnormal; Notable for the  following components:      Result Value   WBC 12.3 (*)    Hemoglobin 11.4 (*)    MCV 78.6 (*)    MCH 24.4 (*)    Neutro Abs 10.3 (*)    All other components within normal limits  COMPREHENSIVE METABOLIC PANEL - Abnormal; Notable for the following components:   CO2 21 (*)    Glucose, Bld 116 (*)    All other components within normal limits  URINALYSIS, ROUTINE W REFLEX MICROSCOPIC - Abnormal; Notable for the following components:   pH 8.5 (*)    All other components within normal limits  LIPASE, BLOOD    EKG None  Radiology CT RENAL STONE STUDY  Result Date: 01/16/2023 CLINICAL DATA:  Right flank pain. EXAM: CT ABDOMEN AND PELVIS WITHOUT CONTRAST TECHNIQUE: Multidetector CT imaging of the abdomen and pelvis was performed following the standard protocol without IV contrast. RADIATION DOSE REDUCTION: This exam was performed according to the departmental dose-optimization program which includes automated exposure control, adjustment of the mA and/or kV according to patient size and/or use of iterative reconstruction technique. COMPARISON:  Chest CT dated 01/27/2019. FINDINGS: Lower chest: No acute abnormality. Hepatobiliary: No focal liver abnormality is seen. No gallstones, gallbladder wall thickening, or biliary dilatation. Pancreas: Unremarkable. No pancreatic ductal dilatation or surrounding inflammatory changes. Spleen: Normal in size without focal abnormality. Adrenals/Urinary Tract: Adrenal glands are unremarkable. An obstructing 8 x 5 mm calculus in the right ureteropelvic junction results in moderate right hydronephrosis and perinephric fat stranding/edema. No additional right renal calculi are seen. The left kidney is normal, without renal calculi, focal lesion, or hydronephrosis. Bladder is unremarkable. Stomach/Bowel: Stomach is within normal limits. Appendix appears normal. No evidence of bowel wall thickening, distention, or inflammatory changes. Vascular/Lymphatic: Aortic  atherosclerosis. No enlarged abdominal or pelvic lymph nodes. Reproductive: Uterus and bilateral adnexa are unremarkable. Other: No abdominal wall hernia or abnormality. No abdominopelvic ascites. Musculoskeletal: No acute or significant osseous findings. IMPRESSION: 1. Obstructing 8 x 5 mm calculus in the right ureteropelvic junction results in moderate right hydronephrosis. Aortic Atherosclerosis (ICD10-I70.0). Electronically Signed   By: Romona Curls M.D.   On: 01/16/2023 09:23    Procedures Procedures    Medications Ordered in ED Medications  sodium chloride 0.9 % bolus 1,000 mL (0 mLs Intravenous Stopped 01/16/23 0949)  morphine (PF) 4 MG/ML injection 4 mg (4 mg Intramuscular Given 01/16/23 0808)  ketorolac (TORADOL) 30  MG/ML injection 30 mg (30 mg Intramuscular Given 01/16/23 0806)  ondansetron (ZOFRAN) injection 4 mg (4 mg Intramuscular Given 01/16/23 0811)  morphine (PF) 4 MG/ML injection 4 mg (4 mg Intravenous Given 01/16/23 6295)    ED Course/ Medical Decision Making/ A&P                                 Medical Decision Making Amount and/or Complexity of Data Reviewed Labs: ordered. Radiology: ordered.  Risk Prescription drug management.   This patient presents to the ED for concern of right flank pain, this involves an extensive number of treatment options, and is a complaint that carries with it a high risk of complications and morbidity.  The differential diagnosis includes kidney stone, uti, cholecystitis, appendicitis   Co morbidities that complicate the patient evaluation   Grave's disease, anemia, and carotid artery stenosis   Additional history obtained:  Additional history obtained from epic chart review External records from outside source obtained and reviewed including husband   Lab Tests:  I Ordered, and personally interpreted labs.  The pertinent results include:  cbc with wbc 12.3, cmp nl, lip nl   Imaging Studies ordered:  I ordered imaging  studies including ct renal  I independently visualized and interpreted imaging which showed  Obstructing 8 x 5 mm calculus in the right ureteropelvic junction  results in moderate right hydronephrosis.    Aortic Atherosclerosis (ICD10-I70.0).   I agree with the radiologist interpretation   Cardiac Monitoring:  The patient was maintained on a cardiac monitor.  I personally viewed and interpreted the cardiac monitored which showed an underlying rhythm of: nsr   Medicines ordered and prescription drug management:  I ordered medication including toradol, zofran, morphine, and ivfs  for sx  Reevaluation of the patient after these medicines showed that the patient improved I have reviewed the patients home medicines and have made adjustments as needed   Test Considered:  ct   Critical Interventions:  Pain control   Problem List / ED Course:  Right sided right UPJ stone:  This stone is large and may not pass on its own.  Pain is under control.  She is given the number to f/u with urology.  She knows to return if worse.     Reevaluation:  After the interventions noted above, I reevaluated the patient and found that they have :improved   Social Determinants of Health:  Lives at home   Dispostion:  After consideration of the diagnostic results and the patients response to treatment, I feel that the patent would benefit from discharge with outpatient f/u.          Final Clinical Impression(s) / ED Diagnoses Final diagnoses:  Kidney stone    Rx / DC Orders ED Discharge Orders          Ordered    oxyCODONE-acetaminophen (PERCOCET/ROXICET) 5-325 MG tablet  Every 6 hours PRN        01/16/23 0931    tamsulosin (FLOMAX) 0.4 MG CAPS capsule  Daily        01/16/23 0931    ondansetron (ZOFRAN-ODT) 4 MG disintegrating tablet  Every 8 hours PRN        01/16/23 0931              Jacalyn Lefevre, MD 01/16/23 1027

## 2023-01-16 NOTE — ED Notes (Signed)
4 attempts for peripheral IV access , no success.

## 2023-01-18 ENCOUNTER — Other Ambulatory Visit: Payer: Self-pay | Admitting: Urology

## 2023-01-19 ENCOUNTER — Encounter (HOSPITAL_BASED_OUTPATIENT_CLINIC_OR_DEPARTMENT_OTHER): Payer: Self-pay | Admitting: Urology

## 2023-01-19 NOTE — Progress Notes (Signed)
Pre-op phone call complete. Procedure date and arrival time confirmed (01/21/2023 at 0645). Patient allergies, medications, and history verified. NPO of food at midnight and can have clear liquids until 0200. Advised to continue to hold vitamins until after procedure. Patient advised not to have any NSAIDs within 48 hours prior and no Aspirin products within 72 hours prior. Patient verbalized understanding of instructions. Driver secured.

## 2023-01-19 NOTE — H&P (Signed)
CC: Right ureteral calculus  HPI:  01/18/2023  62 year old female went to the emergency department with severe right-sided abdominal pain and nausea and vomiting. CT scan was performed that revealed an 8 x 5 mm right ureteropelvic junction calculus with moderate right hydronephrosis. Creatinine was 0.96, white blood cell count 12.3, urinalysis negative for infection. Stone was clearly visible on KUB. Still having some pain discomfort but tolerable right now. Denies any fever.     ALLERGIES: No Known Drug Allergies    MEDICATIONS: Tamsulosin Hcl 0.4 mg capsule  Ibuprofen  Methimazole  Ondansetron Hcl  Oxycodone Hcl     GU PSH: None   NON-GU PSH: None   GU PMH: None   NON-GU PMH: Hypercholesterolemia Hypertension Hyperthyroidism    FAMILY HISTORY: Alzheimer's Disease - Father stroke - Mother   SOCIAL HISTORY: Marital Status: Married Preferred Language: English; Race: Black or African American Current Smoking Status: Patient has never smoked.   Tobacco Use Assessment Completed: Used Tobacco in last 30 days? Does not drink caffeine.    REVIEW OF SYSTEMS:    GU Review Female:   Patient denies frequent urination, hard to postpone urination, burning /pain with urination, get up at night to urinate, leakage of urine, stream starts and stops, trouble starting your stream, have to strain to urinate, and being pregnant.  Gastrointestinal (Upper):   Patient reports nausea and vomiting. Patient denies indigestion/ heartburn.  Gastrointestinal (Lower):   Patient denies diarrhea and constipation.  Constitutional:   Patient reports night sweats, weight loss, and fatigue. Patient denies fever.  Skin:   Patient denies skin rash/ lesion and itching.  Eyes:   Patient denies blurred vision and double vision.  Ears/ Nose/ Throat:   Patient reports sore throat. Patient denies sinus problems.  Hematologic/Lymphatic:   Patient denies swollen glands and easy bruising.  Cardiovascular:   Patient  denies leg swelling and chest pains.  Respiratory:   Patient reports shortness of breath. Patient denies cough.  Endocrine:   Patient denies excessive thirst.  Musculoskeletal:   Patient reports back pain. Patient denies joint pain.  Neurological:   Patient reports dizziness. Patient denies headaches.  Psychologic:   Patient reports anxiety. Patient denies depression.   VITAL SIGNS:      01/18/2023 01:03 PM  Weight 134 lb / 60.78 kg  Height 64 in / 162.56 cm  BP 160/86 mmHg  Heart Rate 88 /min  Temperature 98.2 F / 36.7 C  BMI 23.0 kg/m   MULTI-SYSTEM PHYSICAL EXAMINATION:    Constitutional: Well-nourished. No physical deformities. Normally developed. Good grooming.  Respiratory: No labored breathing, no use of accessory muscles.   Cardiovascular: Normal temperature, normal extremity pulses, no swelling, no varicosities.  Skin: No paleness, no jaundice, no cyanosis. No lesion, no ulcer, no rash.  Neurologic / Psychiatric: Oriented to time, oriented to place, oriented to person. No depression, no anxiety, no agitation.  Gastrointestinal: No mass, no tenderness, no rigidity, non obese abdomen.  Eyes: Normal conjunctivae. Normal eyelids.  Musculoskeletal: Normal gait and station of head and neck.     Complexity of Data:  Source Of History:  Patient  Lab Test Review:   BMP, CBC with Diff  Records Review:   Previous Doctor Records, Previous Patient Records  Urine Test Review:   Urinalysis  X-Ray Review: C.T. Abdomen/Pelvis: Reviewed Films. Reviewed Report. Discussed With Patient.     PROCEDURES: None   ASSESSMENT:      ICD-10 Details  1 GU:   Ureteral calculus - N20.1  Undiagnosed New Problem   PLAN:           Document Letter(s):  Created for Patient: Clinical Summary         Notes:   We discussed the management of urinary stones. These options include observation, ureteroscopy, and shockwave lithotripsy. We discussed which options are relevant to these particular stones. We  discussed the natural history of stones as well as the complications of untreated stones and the impact on quality of life without treatment as well as with each of the above listed treatments. We also discussed the efficacy of each treatment in its ability to clear the stone burden. With any of these management options I discussed the signs and symptoms of infection and the need for emergent treatment should these be experienced. For each option we discussed the ability of each procedure to clear the patient of their stone burden.   For observation I described the risks which include but are not limited to silent renal damage, life-threatening infection, need for emergent surgery, failure to pass stone, and pain.   For ureteroscopy I described the risks which include heart attack, stroke, pulmonary embolus, death, bleeding, infection, damage to contiguous structures, positioning injury, ureteral stricture, ureteral avulsion, ureteral injury, need for ureteral stent, inability to perform ureteroscopy, need for an interval procedure, inability to clear stone burden, stent discomfort and pain.   For shockwave lithotripsy I described the risks which include arrhythmia, kidney contusion, kidney hemorrhage, need for transfusion, pain, inability to break up stone, inability to pass stone fragments, Steinstrasse, infection associated with obstructing stones, need for different surgical procedure, need for repeat shockwave lithotripsy.   She would like to proceed with right ESWL.

## 2023-01-20 NOTE — Progress Notes (Signed)
Patient had question about taking Atenolol before procedure. RN told patient it would be okay to take.

## 2023-01-21 ENCOUNTER — Encounter (HOSPITAL_BASED_OUTPATIENT_CLINIC_OR_DEPARTMENT_OTHER): Payer: Self-pay | Admitting: Urology

## 2023-01-21 ENCOUNTER — Ambulatory Visit (HOSPITAL_BASED_OUTPATIENT_CLINIC_OR_DEPARTMENT_OTHER)
Admission: RE | Admit: 2023-01-21 | Discharge: 2023-01-21 | Disposition: A | Discharge status: Home / Self Care | Payer: No Typology Code available for payment source | Attending: Urology | Admitting: Urology

## 2023-01-21 ENCOUNTER — Encounter (HOSPITAL_BASED_OUTPATIENT_CLINIC_OR_DEPARTMENT_OTHER)
Admission: RE | Disposition: A | Discharge status: Home / Self Care | Payer: Self-pay | Source: Home / Self Care | Attending: Urology

## 2023-01-21 ENCOUNTER — Other Ambulatory Visit: Payer: Self-pay

## 2023-01-21 ENCOUNTER — Ambulatory Visit (HOSPITAL_COMMUNITY): Payer: No Typology Code available for payment source

## 2023-01-21 DIAGNOSIS — I10 Essential (primary) hypertension: Secondary | ICD-10-CM | POA: Diagnosis not present

## 2023-01-21 DIAGNOSIS — N132 Hydronephrosis with renal and ureteral calculous obstruction: Secondary | ICD-10-CM | POA: Diagnosis present

## 2023-01-21 HISTORY — DX: Personal history of urinary calculi: Z87.442

## 2023-01-21 HISTORY — PX: EXTRACORPOREAL SHOCK WAVE LITHOTRIPSY: SHX1557

## 2023-01-21 SURGERY — LITHOTRIPSY, ESWL
Anesthesia: LOCAL | Laterality: Right

## 2023-01-21 MED ORDER — DIAZEPAM 5 MG PO TABS
10.0000 mg | ORAL_TABLET | ORAL | Status: AC
  Administered 2023-01-21: 10 mg via ORAL

## 2023-01-21 MED ORDER — SODIUM CHLORIDE 0.9% FLUSH: 3.0000 mL | Freq: Two times a day (BID) | INTRAVENOUS | Status: DC

## 2023-01-21 MED ORDER — CIPROFLOXACIN HCL 500 MG PO TABS
500.0000 mg | ORAL_TABLET | ORAL | Status: AC
  Administered 2023-01-21: 500 mg via ORAL

## 2023-01-21 MED ORDER — DIAZEPAM 5 MG PO TABS
ORAL_TABLET | ORAL | Status: AC
  Filled 2023-01-21: qty 2

## 2023-01-21 MED ORDER — OXYCODONE-ACETAMINOPHEN 5-325 MG PO TABS: 1.0000 | ORAL_TABLET | Freq: Four times a day (QID) | ORAL | 0 refills | Status: DC | PRN

## 2023-01-21 MED ORDER — ONDANSETRON 4 MG PO TBDP: 4.0000 mg | ORAL_TABLET | Freq: Three times a day (TID) | ORAL | 0 refills | Status: DC | PRN

## 2023-01-21 MED ORDER — DIPHENHYDRAMINE HCL 25 MG PO CAPS
25.0000 mg | ORAL_CAPSULE | ORAL | Status: AC
  Administered 2023-01-21: 25 mg via ORAL

## 2023-01-21 MED ORDER — DIPHENHYDRAMINE HCL 25 MG PO CAPS
ORAL_CAPSULE | ORAL | Status: AC
  Filled 2023-01-21: qty 1

## 2023-01-21 MED ORDER — SODIUM CHLORIDE 0.9 % IV SOLN
INTRAVENOUS | Status: DC
Start: 2023-01-21 — End: 2023-01-21

## 2023-01-21 MED ORDER — CIPROFLOXACIN HCL 500 MG PO TABS
ORAL_TABLET | ORAL | Status: AC
  Filled 2023-01-21: qty 1

## 2023-01-21 NOTE — Interval H&P Note (Signed)
History and Physical Interval Note: No change in stone.   01/21/2023 9:07 AM  Sandra Murray  has presented today for surgery, with the diagnosis of RIGHT URETERAL STONE.  The various methods of treatment have been discussed with the patient and family. After consideration of risks, benefits and other options for treatment, the patient has consented to  Procedure(s): RIGHT EXTRACORPOREAL SHOCK WAVE LITHOTRIPSY (ESWL) (Right) as a surgical intervention.  The patient's history has been reviewed, patient examined, no change in status, stable for surgery.  I have reviewed the patient's chart and labs.  Questions were answered to the patient's satisfaction.     Bjorn Pippin

## 2023-01-21 NOTE — Discharge Instructions (Signed)

## 2023-01-21 NOTE — Op Note (Signed)
See [facility] Op note.

## 2023-01-24 ENCOUNTER — Encounter (HOSPITAL_BASED_OUTPATIENT_CLINIC_OR_DEPARTMENT_OTHER): Payer: Self-pay | Admitting: Urology

## 2023-03-08 ENCOUNTER — Ambulatory Visit: Payer: No Typology Code available for payment source | Admitting: Internal Medicine

## 2023-03-08 ENCOUNTER — Encounter: Payer: Self-pay | Admitting: Internal Medicine

## 2023-03-08 VITALS — BP 148/76 | HR 88 | Ht 64.0 in | Wt 139.6 lb

## 2023-03-08 DIAGNOSIS — E0500 Thyrotoxicosis with diffuse goiter without thyrotoxic crisis or storm: Secondary | ICD-10-CM | POA: Diagnosis not present

## 2023-03-08 DIAGNOSIS — E559 Vitamin D deficiency, unspecified: Secondary | ICD-10-CM | POA: Diagnosis not present

## 2023-03-08 NOTE — Progress Notes (Signed)
Patient ID: Pacience Lavere, female   DOB: 12-04-1960, 62 y.o.   MRN: [id number]   HPI  Jimmie Rozsa is a 62 y.o.-year-old female, initially referred by her gastroenterologist, Dr. Loreta Ave, returning for follow-up for Graves' disease.  Last visit 1 year ago.  Interim history: Patient denies: Tremors, palpitations, chest pain, heat intolerance, cold intolerance, constipation.  She had weight loss since last visit, approximately 5 pounds. Previously exercising by walking and dancing but at last visit she was not exercising as she was too busy. She had a kidney stone in 12/2022 - had ESWL. She has 3 stones left in - asymptomatic.   She is building a house-notices an apartment.  Increased stress.  She does not have time to exercise lately.  Reviewed history: She started to have some  weight loss and bone pain in 2018 >> presented to see PCP  - TSH was undetectable. She was sent to a rheumatologist for suspicion for RA >> testing was negative.  No intervention was suggested for her abnormal thyroid tests then.  In 10/2017, she started to have: - palpitations - whooshing sound in year - lost weight (also very stressed as she is taking care of her father who has dementia) - tremors - anxiety - insomnia  She presented to see her PCP (Dr. Leavy Cella) >> TSH was again undetectable.  Again, no intervention was suggested.  She then saw Dr. Loreta Ave for a screening colonoscopy >> Dr. Loreta Ave referred her to me urgently for her thyrotoxicosis.  We initially started: -Methimazole 10 mg 2x a day -Atenolol 25 mg daily She did not return for repeat TFTs 1 month after starting the medication (miscommunication?).  However, when she returned to the clinic for another she was telling me that she felt much better.  She gained weight, did not have palpitations or tremors.  At that time, her TSH was very high, at 46.86.  We decrease the methimazole to 5 mg daily and ended up stopping it completely in 06/2018.  At  that time, we also started to taper down atenolol and then stop completely.  Subsequent TFTs were normal in 07/2018, however, she called [place] that she started to have hyperthyroid symptoms again and we restarted a low-dose methimazole, 5 mg daily.  However, in 09/2018, her TSH was again high, at 15.86 I will stop methimazole.  She did not return for repeat labs, and presented to the emergency room in 01/2019 with tachycardia, palpitations, and the TSH was found to be undetectable.  We restarted methimazole, at 5 mg twice a day.  She called [place] in 08/2018 for diplopia and I directed her to her ophthalmologist.  She actually saw a optometrist who noticed an abnormality in her gaze and told her that this is possibly related to the thyroid condition, but did not suggest any other follow-up or treatment.  I referred her to Dr. Toni Arthurs and she did establish care with her.  She initialy had diplopia especially when looking up, then much better on diclofenac - now stopped.  02/2019: Reduced methimazole dose to 5 mg daily  03/2019: We stopped methimazole as TSH was elevated.  07/2019: We restarted methimazole 5 mg daily  11/2019: Stopped methimazole as TSH was again elevated.  01/2020: Restarted MMI 2.5 mg daily  03/2020: Developed headaches and heart rates in the 120s to 130s  04/2020: Started atenolol 25 mg prn by PCP   02/2022: Decreased MMI to 2.5 mg every other day  12/2022: came off MMI at the time  of her kidney stone - for 3 weeks  01/2023: restarted MMI 2.5 mg qod  I reviewed her TFTs: Lab Results  Component Value Date   TSH 1.78 08/30/2022   TSH 3.96 04/20/2022   TSH 6.95 (H) 03/19/2022   TSH 1.07 08/19/2021   TSH 1.94 02/18/2021   TSH 0.93 08/13/2020   TSH 0.46 06/30/2020   TSH <0.01 (L) 03/26/2020   TSH <0.01 (L) 02/12/2020   TSH 11.12 (H) 12/19/2019   FREET4 0.60 08/30/2022   FREET4 0.66 04/20/2022   FREET4 0.8 03/19/2022   FREET4 0.79 08/19/2021   FREET4 0.76 02/18/2021    FREET4 0.70 08/13/2020   FREET4 0.64 06/30/2020   FREET4 0.70 03/26/2020   FREET4 1.15 02/12/2020   FREET4 0.56 (L) 12/19/2019   T3FREE 2.8 08/30/2022   T3FREE 3.4 04/20/2022   T3FREE 3.0 03/19/2022   T3FREE 3.7 08/19/2021   T3FREE 3.6 02/18/2021   T3FREE 2.9 08/13/2020   T3FREE 3.0 06/30/2020   T3FREE 3.1 03/26/2020   T3FREE 4.3 (H) 02/12/2020   T3FREE 3.2 12/19/2019  04/05/2020: TSH <0.01 02/08/2018: TSH <0.010 12/15/2016: TSH <0.010 12/18/2015: TSH 2.192  TSI's are elevated: Lab Results  Component Value Date   TSI 389 (H) 09/13/2022   TSI 471 (H) 08/19/2021   TSI 571 (H) 02/18/2021   TSI 450 (H) 08/15/2019   TSI 389 (H) 10/09/2018   TSI 504 (H) 02/24/2018   Pt denies: - feeling nodules in neck - hoarseness - dysphagia - choking  No FH of thyroid disease or cancer. No h/o radiation tx to head or neck. No herbal supplements. No Biotin use. No recent steroids use.   Vitamin deficiency   Reviewed vitamin D levels: 02/28/2023: vitamin D 50.4 Lab Results  Component Value Date   VD25OH 44 03/19/2022   VD25OH 57.7 08/13/2020   VD25OH 52.1 08/15/2019   VD25OH 36.48 02/27/2019   VD25OH 28.27 (L) 10/09/2018  02/18/2021: vitamin D 50 02/08/2018: vit D 14 12/18/2015: vit D 12  She is on 2000 units vitamin D daily.    She also has a history of anemia.  ROS:+ see HPI  I reviewed pt's medications, allergies, PMH, social hx, family hx, and changes were documented in the history of present illness. Otherwise, unchanged from my initial visit note.  PMH: -See above  No past surgical history  Social History   Socioeconomic History   Marital status: Married    Spouse name: Not on file   Number of children: 0   Years of education: Not on file   Highest education level: Not on file  Occupational History   Director of Human Resources  Tobacco Use   Smoking status: Never Smoker   Smokeless tobacco: Never Used  Substance and Sexual Activity   Alcohol use: No    Drug use: No   Current Outpatient Medications  Medication Sig Dispense Refill   atenolol (TENORMIN) 25 MG tablet TAKE 1 TABLET (25 MG TOTAL) BY MOUTH DAILY. 90 tablet 1   calcium-vitamin D (OSCAL WITH D) 500-200 MG-UNIT tablet Take 1 mcg by mouth daily.     Cholecalciferol (VITAMIN D3) 25 MCG (1000 UT) CAPS Take by mouth. Take 1 tablet by mouth three times weekly.     Cyanocobalamin (VITAMIN B 12 PO) Take 1 mg by mouth daily.     ibuprofen (ADVIL) 200 MG tablet Take 200 mg by mouth every 6 (six) hours as needed for moderate pain (pain score 4-6).     methimazole (TAPAZOLE) 5  MG tablet TAKE 1/2 TABLET BY MOUTH DAILY 45 tablet 3   Omega-3 Fatty Acids (FISH OIL) 1000 MG CAPS Take by mouth.     ondansetron (ZOFRAN-ODT) 4 MG disintegrating tablet Take 1 tablet (4 mg total) by mouth every 8 (eight) hours as needed. 20 tablet 0   oxyCODONE-acetaminophen (PERCOCET/ROXICET) 5-325 MG tablet Take 1 tablet by mouth every 6 (six) hours as needed for severe pain (pain score 7-10). 15 tablet 0   riboflavin (VITAMIN B-2) 100 MG TABS tablet Take 1 tablet by mouth daily.     Selenium 200 MCG CAPS Take 1 tablet by mouth 2 (two) times daily.     tamsulosin (FLOMAX) 0.4 MG CAPS capsule Take 1 capsule (0.4 mg total) by mouth daily. 14 capsule 0   No current facility-administered medications for this visit.    No Known Allergies   Family History  Problem Relation Age of Onset   Stroke Mother    Heart disease Mother    Alzheimer's disease Father    Colon cancer Neg Hx    Colon polyps Neg Hx    Esophageal cancer Neg Hx    Stomach cancer Neg Hx    Rectal cancer Neg Hx     Mother  - heart ds Father  - Prostate cancer  PE: BP (!) 148/76 Comment: Seeing PCP for HTN <140/90  Pulse 88   Ht 5\' 4"  (1.626 m)   Wt 139 lb 9.6 oz (63.3 kg)   SpO2 99%   BMI 23.96 kg/m  Wt Readings from Last 10 Encounters:  03/08/23 139 lb 9.6 oz (63.3 kg)  01/21/23 134 lb (60.8 kg)  01/16/23 137 lb (62.1 kg)  04/06/22 143  lb 9.6 oz (65.1 kg)  03/19/22 143 lb 3.2 oz (65 kg)  08/19/21 142 lb 12.8 oz (64.8 kg)  05/13/21 141 lb (64 kg)  04/29/21 141 lb 12.8 oz (64.3 kg)  02/18/21 140 lb 6.4 oz (63.7 kg)  11/13/20 135 lb 6 oz (61.4 kg)   Constitutional: normal weight, in NAD Eyes: EOMI, + mild exophthalmos, + stare ENT:  no thyromegaly, no cervical lymphadenopathy Cardiovascular: RRR, No RG, +1/6 SEM Respiratory: CTA B Musculoskeletal: no deformities Skin:no rashes Neurological: no tremor with outstretched hands  ASSESSMENT: 1. Graves ds.  2.  Diplopia  3. Vit D deficiency  PLAN:  1. Patient with history of low TSH, comprises Graves' disease based on my TSI antibodies.  She initially had thyrotoxic symptoms including weight loss, palpitations, tremors, anxiety, resolved after starting methimazole. - We discussed in the past about possible consequences of uncontrolled thyrotoxicosis to include arrhythmia, heart failure, coagulability with the risk of stroke, PE, AMI, sudden death.   -We initially started methimazole and atenolol, however, she did not return for labs in the next TSH returned very high, at 47.  At that time, we decreased her methimazole dose and ended up stopping methimazole completely in 06/2018.  We had to restart methimazole afterwards and stopped again in 09/2018.  We again had to restart methimazole and stopped in 11/2019.  In 01/2020 we started again on a low-dose methimazole, 2.5 mg daily and at last visit we decreased the dose to 2.5 mg every other day.  She is not taking this dose.  She was off the medication for about 3 weeks during her kidney stone episode but she resumed it afterwards. -Due to the variability in her TFTs, we discussed that we need to be very careful decreasing her methimazole dose further -Did previously  suggest definitive treatment for Graves' disease and the former RAI treatment or surgery.  We discussed that this could be done with transaxillary approach.  Due to  the mild exophthalmos, she is a better candidate for surgery rather than RAI treatment.  However, she declined both.   -Of note, her TSI antibodies are elevated, with the last level being in 08/2022, and appearing to be improved -She continues to see Dr. Ralene Muskrat at [facility].  She has exophthalmia with diplopia with upward gaze.  She was tried on diclofenac and this helped.  She was able to come off afterwards.  Solu-Medrol and Alveda Reasons were considered but were not needed.  She continues on selenium 200 mcg daily.  She sees Dr. Toni Arthurs regularly. -She was on atenolol in the past and able to come off but then had to restart it due to high pulse and palpitations.  We restarted this at last visit, on an as-needed basis.  She is monitoring her heart rate with her smart watch.  At last visit, heart rate was elevated, but she rushed over here.  At today's visit, pulse is normal. -At today's visit she declines hypothyroid symptoms including tremors, unintentional weight loss.  Reviewing her weight history, she lost 9 pounds since last visit.  She mentions that this was related to her kidney stone episodes -Will recheck her TFTs today-if we need to decrease the methimazole dose, we may need to do an even slower taper than before. -I will see her back in 1 year, but with labs in 6 months  2.  Vit D def -She is on 2000 units vitamin D daily -No generalized joint pains -Vitamin D levels have been normal recently as checked by PCP  Component     Latest Ref Rng 03/08/2023  Triiodothyronine,Free,Serum     2.3 - 4.2 pg/mL 3.1   T4,Free(Direct)     0.8 - 1.8 ng/dL 0.9   TSH     4.09 - 8.11 mIU/L 3.32   TSI     <140 % baseline 270 (H)   TFTs are normal. TSI's are still elevated but improved. Will recheck her TFTs and TSI's in 6 months.  If TSI is normalized, we can stop the methimazole.  For now, we will continue the current regimen.  Carlus Pavlov, MD PhD [facility] Endocrin[facility]

## 2023-03-08 NOTE — Patient Instructions (Signed)
Please continue methimazole 2.5 mg every other day.  Also, continue vitamin D 2000 units daily.  Please stop at the lab.  Please come back for a follow-up appointment in 1 year but for labs in 6 months.

## 2023-03-10 LAB — THYROID STIMULATING IMMUNOGLOBULIN: TSI: 270 %{baseline} — ABNORMAL HIGH (ref <140)

## 2023-03-10 LAB — T3, FREE: T3, Free: 3.1 pg/mL (ref 2.3–4.2)

## 2023-03-10 LAB — TSH: TSH: 3.32 m[IU]/L (ref 0.40–4.50)

## 2023-03-10 LAB — T4, FREE: Free T4: 0.9 ng/dL (ref 0.8–1.8)

## 2023-04-07 ENCOUNTER — Ambulatory Visit: Payer: Self-pay | Admitting: Cardiology

## 2023-04-12 ENCOUNTER — Other Ambulatory Visit: Payer: Self-pay | Admitting: Internal Medicine

## 2023-04-12 NOTE — Telephone Encounter (Signed)
Atenolol refill request complete

## 2023-05-17 ENCOUNTER — Other Ambulatory Visit (HOSPITAL_BASED_OUTPATIENT_CLINIC_OR_DEPARTMENT_OTHER): Payer: Self-pay | Admitting: Family Medicine

## 2023-05-17 DIAGNOSIS — Z13820 Encounter for screening for osteoporosis: Secondary | ICD-10-CM

## 2023-06-07 ENCOUNTER — Ambulatory Visit (HOSPITAL_BASED_OUTPATIENT_CLINIC_OR_DEPARTMENT_OTHER)
Admission: RE | Admit: 2023-06-07 | Discharge: 2023-06-07 | Disposition: A | Discharge status: Home / Self Care | Payer: No Typology Code available for payment source | Source: Ambulatory Visit | Attending: Family Medicine | Admitting: Family Medicine

## 2023-06-07 DIAGNOSIS — Z13820 Encounter for screening for osteoporosis: Secondary | ICD-10-CM | POA: Insufficient documentation

## 2023-08-08 ENCOUNTER — Encounter: Payer: Self-pay | Admitting: Dermatology

## 2023-08-08 ENCOUNTER — Ambulatory Visit: Admitting: Dermatology

## 2023-08-08 VITALS — BP 173/69

## 2023-08-08 DIAGNOSIS — L810 Postinflammatory hyperpigmentation: Secondary | ICD-10-CM

## 2023-08-08 MED ORDER — SAFETY SEAL MISCELLANEOUS MISC: 1.0000 | Freq: Every day | 4 refills | Status: AC

## 2023-08-08 NOTE — Patient Instructions (Addendum)
 Date: Mon Aug 08 2023  Hello Del[place],  Thank you for visiting today. Here is a summary of the key instructions:  - Skin Care:   - Apply prescription lightening cream from [facility] morning and night   - Use Eucerin Dual Serum (active ingredient) mixed with the prescription cream   - Apply Aquaphor to heal scabby or oozy areas   - Wear sunscreen daily, especially when outdoors for more than 20 minutes   - Use a mineral sunscreen for better protection of treated areas  - Hair Removal:   - Consider laser hair removal or electrolysis for permanent hair removal   - Use a stick-type battery-operated facial hair trimmer instead of tweezers  - Medications:   - Prescription lightening cream with tranexamic acid   - Will be compounded at [facility]   - Costs about $45   - [facility] will call to verify address and collect payment  - Follow-up: Return for follow-up appointment in 4 months  - Additional Instructions:   - Contact us  through MyChart if you have any issues with the prescription   - Try the Vanicream mineral sunscreen sample provided   - Purchase additional Vanicream sunscreen online or in stores if you like it  Please reach out if you have any questions or concerns.  Warm regards,  Dr. Louana Roup, Dermatology     Your provider has sent your prescription to [facility] in [place], [place] . A pharmacy representative will call you to confirm details and take your payment information. If you do not receive a call within 24 hours, please contact the pharmacy at [phone] or [phone]. Your unique skincare compound is being formulated in our lab (most compounds take less than 24 hours). Your prescription is shipped vis USPS to your mailbox (2-4 business days). Priority shipping is available at an additional cost. Once received, you will electronically sign/acknowledge that you received your prescription. The pharmacy hours are Monday-Friday 9 am-6 pm EST and  Saturday 9 am-1 pm EST.      Important Information  Due to recent changes in healthcare laws, you may see results of your pathology and/or laboratory studies on MyChart before the doctors have had a chance to review them. We understand that in some cases there may be results that are confusing or concerning to you. Please understand that not all results are received at the same time and often the doctors may need to interpret multiple results in order to provide you with the best plan of care or course of treatment. Therefore, we ask that you please give us  2 business days to thoroughly review all your results before contacting the office for clarification. Should we see a critical lab result, you will be contacted sooner.   If You Need Anything After Your Visit  If you have any questions or concerns for your doctor, please call our main line at [phone] If no one answers, please leave a voicemail as directed and we will return your call as soon as possible. Messages left after 4 pm will be answered the following business day.   You may also send us  a message via MyChart. We typically respond to MyChart messages within 1-2 business days.  For prescription refills, please ask your pharmacy to contact our office. Our fax number is [phone].  If you have an urgent issue when the clinic is closed that cannot wait until the next business day, you can page your doctor at the number below.    Please  note that while we do our best to be available for urgent issues outside of office hours, we are not available 24/7.   If you have an urgent issue and are unable to reach us , you may choose to seek medical care at your doctor's office, retail clinic, urgent care center, or emergency room.  If you have a medical emergency, please immediately call 911 or go to the emergency department. In the event of inclement weather, please call our main line at [phone] for an update on the status of any  delays or closures.  Dermatology Medication Tips: Please keep the boxes that topical medications come in in order to help keep track of the instructions about where and how to use these. Pharmacies typically print the medication instructions only on the boxes and not directly on the medication tubes.   If your medication is too expensive, please contact our office at [phone] or send us  a message through MyChart.   We are unable to tell what your co-pay for medications will be in advance as this is different depending on your insurance coverage. However, we may be able to find a substitute medication at lower cost or fill out paperwork to get insurance to cover a needed medication.   If a prior authorization is required to get your medication covered by your insurance company, please allow us  1-2 business days to complete this process.  Drug prices often vary depending on where the prescription is filled and some pharmacies may offer cheaper prices.  The website www.goodrx.com contains coupons for medications through different pharmacies. The prices here do not account for what the cost may be with help from insurance (it may be cheaper with your insurance), but the website can give you the price if you did not use any insurance.  - You can print the associated coupon and take it with your prescription to the pharmacy.  - You may also stop by our office during regular business hours and pick up a GoodRx coupon card.  - If you need your prescription sent electronically to a different pharmacy, notify our office through [facility] or by phone at [phone]

## 2023-08-08 NOTE — Progress Notes (Signed)
   New Patient Visit   Subjective  Sandra Murray is a 63 y.o. female who presents for the following: Hyperpigmentation of face. She notices it under her chin and has been there for about 4 years. She says it has been years since since she has tried any medication. She has used a tretinoin but is was about 30 years ago. She does remove hairs from the area with tweezers and a round electric razor.    The following portions of the chart were reviewed this encounter and updated as appropriate: medications, allergies, medical history  Review of Systems:  No other skin or systemic complaints except as noted in HPI or Assessment and Plan.  Objective  Well appearing patient in no apparent distress; mood and affect are within normal limits.  A focused examination was performed of the following areas: Face   Relevant exam findings are noted in the Assessment and Plan.           Assessment & Plan   1. Post-inflammatory hyperpigmentation of the chin - Assessment: Dark patches and spots under the chin present for the past couple of years, appearing as scab-like lesions without associated pimples or irritation. Hyperpigmentation likely post-traumatic, triggered by hair removal practices using tweezers and a battery-operated device. Differential diagnosis includes contact dermatitis to metal in the hair removal device. Condition consistent with post-inflammatory hyperpigmentation.  - Plan:    Prescribe compounded lightening cream containing tranexamic acid from [facility]     - Apply morning and night    Recommend over-the-counter Eucerin Dual Serum (active ingredient formulation)     - Use in combination with prescription cream    Advise on proper wound care:     - Apply Aquaphor to scabby or oozy areas for healing    Recommend switch to stick-type battery-operated facial hair trimmer    Educate on daily sunscreen use:     - Provide sample of Bani Cream mineral sunscreen  for face     - Emphasize importance of mineral sunscreen for extended sun exposure    Schedule follow-up appointment in 4 months to assess progress    Discuss potential for laser hair removal or electrolysis if hair growth is a precipitating factor  2. 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 This condition is associated with hormonal changes after age 37. Full assessment needed to rule out other types of alopecia. - Plan:    Perform full hair and scalp assessment at next visit    Educate patient on the importance of early intervention for hair thinning     Return in about 4 months (around 12/09/2023).  I, Eliot Guernsey, CMA, am acting as scribe for [employer], DO .   Documentation: I have reviewed the above documentation for accuracy and completeness, and I agree with the above.  Louana Roup, DO

## 2023-08-31 ENCOUNTER — Ambulatory Visit: Payer: No Typology Code available for payment source | Admitting: Dermatology

## 2023-10-23 ENCOUNTER — Other Ambulatory Visit: Payer: Self-pay | Admitting: Internal Medicine

## 2023-12-12 ENCOUNTER — Ambulatory Visit: Admitting: Dermatology

## 2024-02-01 ENCOUNTER — Other Ambulatory Visit: Payer: Self-pay | Admitting: Internal Medicine

## 2024-03-13 ENCOUNTER — Encounter: Payer: Self-pay | Admitting: Internal Medicine

## 2024-03-13 ENCOUNTER — Ambulatory Visit (INDEPENDENT_AMBULATORY_CARE_PROVIDER_SITE_OTHER): Payer: No Typology Code available for payment source | Admitting: Internal Medicine

## 2024-03-13 VITALS — BP 142/70 | HR 87 | Ht 64.0 in | Wt 137.6 lb

## 2024-03-13 DIAGNOSIS — E0500 Thyrotoxicosis with diffuse goiter without thyrotoxic crisis or storm: Secondary | ICD-10-CM | POA: Diagnosis not present

## 2024-03-13 DIAGNOSIS — E559 Vitamin D deficiency, unspecified: Secondary | ICD-10-CM

## 2024-03-13 NOTE — Progress Notes (Addendum)
 Patient ID: Sandra Murray, female   DOB: 10/17/1960, 63 y.o.   MRN: [id number]   HPI  Sandra Murray is a 63 y.o.-year-old female, initially referred by her gastroenterologist, Dr. Kristie, returning for follow-up for Graves' disease.  Last visit 1 year ago.  Interim history: Patient denies: Tremors, palpitations, chest pain, heat intolerance, cold intolerance, constipation.   She had a 5 pound weight loss before last visit.  She lost 2 more pounds since then. She moved in a new house in 09/2023. She has a [employer] and a [employer].  However, she did not restart exercise yet.  Reviewed history: She started to have some  weight loss and bone pain in 2018 >> presented to see PCP  - TSH was undetectable. She was sent to a rheumatologist for suspicion for RA >> testing was negative.  No intervention was suggested for her abnormal thyroid  tests then.  In 10/2017, she started to have: - palpitations - whooshing sound in year - lost weight (also very stressed as she is taking care of her father who has dementia) - tremors - anxiety - insomnia  She presented to see her PCP (Dr. Jolee) >> TSH was again undetectable.  Again, no intervention was suggested.  She then saw Dr. Kristie for a screening colonoscopy >> Dr. Kristie referred her to me urgently for her thyrotoxicosis.  We initially started: -Methimazole  10 mg 2x a day -Atenolol  25 mg daily She did not return for repeat TFTs 1 month after starting the medication (miscommunication?).  However, when she returned to the clinic for another she was telling me that she felt much better.  She gained weight, did not have palpitations or tremors.  At that time, her TSH was very high, at 46.86.  We decrease the methimazole  to 5 mg daily and ended up stopping it completely in 06/2018.  At that time, we also started to taper down atenolol  and then stop completely.  Subsequent TFTs were normal in 07/2018, however, she called us  that she started to have  hyperthyroid symptoms again and we restarted a low-dose methimazole , 5 mg daily.  However, in 09/2018, her TSH was again high, at 15.86 I will stop methimazole .  She did not return for repeat labs, and presented to the emergency room in 01/2019 with tachycardia, palpitations, and the TSH was found to be undetectable.  We restarted methimazole , at 5 mg twice a day.  She called us  in 08/2018 for diplopia and I directed her to her ophthalmologist.  She actually saw a optometrist who noticed an abnormality in her gaze and told her that this is possibly related to the thyroid  condition, but did not suggest any other follow-up or treatment.  I referred her to Dr. Melba and she did establish care with her.  She initialy had diplopia especially when looking up, then much better on diclofenac - now stopped.  02/2019: Reduced methimazole  dose to 5 mg daily  03/2019: We stopped methimazole  as TSH was elevated.  07/2019: We restarted methimazole  5 mg daily  11/2019: Stopped methimazole  as TSH was again elevated.  01/2020: Restarted MMI 2.5 mg daily  03/2020: Developed headaches and heart rates in the 120s to 130s  04/2020: Started atenolol  25 mg prn by PCP   02/2022: Decreased MMI to 2.5 mg every other day  12/2022: came off MMI at the time of her kidney stone - for 3 weeks  01/2023: restarted MMI 2.5 mg qod  I reviewed her TFTs: Lab Results  Component Value  Date   TSH 3.32 03/08/2023   TSH 1.78 08/30/2022   TSH 3.96 04/20/2022   TSH 6.95 (H) 03/19/2022   TSH 1.07 08/19/2021   TSH 1.94 02/18/2021   TSH 0.93 08/13/2020   TSH 0.46 06/30/2020   TSH <0.01 (L) 03/26/2020   TSH <0.01 (L) 02/12/2020   FREET4 0.9 03/08/2023   FREET4 0.60 08/30/2022   FREET4 0.66 04/20/2022   FREET4 0.8 03/19/2022   FREET4 0.79 08/19/2021   FREET4 0.76 02/18/2021   FREET4 0.70 08/13/2020   FREET4 0.64 06/30/2020   FREET4 0.70 03/26/2020   FREET4 1.15 02/12/2020   T3FREE 3.1 03/08/2023   T3FREE 2.8  08/30/2022   T3FREE 3.4 04/20/2022   T3FREE 3.0 03/19/2022   T3FREE 3.7 08/19/2021   T3FREE 3.6 02/18/2021   T3FREE 2.9 08/13/2020   T3FREE 3.0 06/30/2020   T3FREE 3.1 03/26/2020   T3FREE 4.3 (H) 02/12/2020  04/05/2020: TSH <0.01 02/08/2018: TSH <0.010 12/15/2016: TSH <0.010 12/18/2015: TSH 2.192  TSI's are elevated: Lab Results  Component Value Date   TSI 270 (H) 03/08/2023   TSI 389 (H) 09/13/2022   TSI 471 (H) 08/19/2021   TSI 571 (H) 02/18/2021   TSI 450 (H) 08/15/2019   TSI 389 (H) 10/09/2018   TSI 504 (H) 02/24/2018   Pt denies: - feeling nodules in neck - hoarseness - dysphagia - choking  No FH of thyroid  disease or cancer. No h/o radiation tx to head or neck. No herbal supplements. No Biotin use. No recent steroids use.   Vitamin deficiency   Reviewed vitamin D  levels: 03/05/2024: Vitamin D  41.5 02/28/2023: vitamin D  50.4 Lab Results  Component Value Date   VD25OH 44 03/19/2022   VD25OH 57.7 08/13/2020   VD25OH 52.1 08/15/2019   VD25OH 36.48 02/27/2019   VD25OH 28.27 (L) 10/09/2018  02/18/2021: vitamin D  50 02/08/2018: vit D 14 12/18/2015: vit D 12  She is on 2000 units vitamin D  daily.    She also has a history of anemia. She had a kidney stone in 12/2022 - had ESWL. She has 3 stones left in - asymptomatic.    ROS:+ see HPI  I reviewed pt's medications, allergies, PMH, social hx, family hx, and changes were documented in the history of present illness. Otherwise, unchanged from my initial visit note.  PMH: -See above  No past surgical history  Social History   Socioeconomic History   Marital status: Married    Spouse name: Not on file   Number of children: 0   Years of education: Not on file   Highest education level: Not on file  Occupational History   Director of Human Resources  Tobacco Use   Smoking status: Never Smoker   Smokeless tobacco: Never Used  Substance and Sexual Activity   Alcohol use: No   Drug use: No   Current  Outpatient Medications  Medication Sig Dispense Refill   atenolol  (TENORMIN ) 25 MG tablet TAKE 1 TABLET (25 MG TOTAL) BY MOUTH DAILY. 90 tablet 1   calcium-vitamin D  (OSCAL WITH D) 500-200 MG-UNIT tablet Take 1 mcg by mouth daily.     Cholecalciferol (VITAMIN D3) 25 MCG (1000 UT) CAPS Take by mouth. Take 1 tablet by mouth three times weekly.     Cyanocobalamin  (VITAMIN B 12 PO) Take 1 mg by mouth daily.     ibuprofen (ADVIL) 200 MG tablet Take 200 mg by mouth every 6 (six) hours as needed for moderate pain (pain score 4-6).  methimazole  (TAPAZOLE ) 5 MG tablet Take 0.5 tablets (2.5 mg total) by mouth every other day. 15 tablet 0   Omega-3 Fatty Acids (FISH OIL) 1000 MG CAPS Take by mouth.     ondansetron  (ZOFRAN -ODT) 4 MG disintegrating tablet Take 1 tablet (4 mg total) by mouth every 8 (eight) hours as needed. 20 tablet 0   oxyCODONE -acetaminophen  (PERCOCET/ROXICET) 5-325 MG tablet Take 1 tablet by mouth every 6 (six) hours as needed for severe pain (pain score 7-10). 15 tablet 0   riboflavin (VITAMIN B-2) 100 MG TABS tablet Take 1 tablet by mouth daily.     Safety Seal Miscellaneous MISC Apply 1 application  topically daily. Medication name: Melaxemic Cream - No tretinoin 15 g 4   Selenium 200 MCG CAPS Take 1 tablet by mouth daily.     tamsulosin  (FLOMAX ) 0.4 MG CAPS capsule Take 1 capsule (0.4 mg total) by mouth daily. 14 capsule 0   No current facility-administered medications for this visit.    No Known Allergies   Family History  Problem Relation Age of Onset   Stroke Mother    Heart disease Mother    Alzheimer's disease Father    Colon cancer Neg Hx    Colon polyps Neg Hx    Esophageal cancer Neg Hx    Stomach cancer Neg Hx    Rectal cancer Neg Hx     Mother  - heart ds Father  - Prostate cancer  PE: BP (!) 142/70   Pulse 87   Ht 5' 4 (1.626 m)   Wt 137 lb 9.6 oz (62.4 kg)   SpO2 99%   BMI 23.62 kg/m  Wt Readings from Last 10 Encounters:  03/13/24 137 lb 9.6 oz  (62.4 kg)  03/08/23 139 lb 9.6 oz (63.3 kg)  01/21/23 134 lb (60.8 kg)  01/16/23 137 lb (62.1 kg)  04/06/22 143 lb 9.6 oz (65.1 kg)  03/19/22 143 lb 3.2 oz (65 kg)  08/19/21 142 lb 12.8 oz (64.8 kg)  05/13/21 141 lb (64 kg)  04/29/21 141 lb 12.8 oz (64.3 kg)  02/18/21 140 lb 6.4 oz (63.7 kg)   Constitutional: normal weight, in NAD Eyes: EOMI, + mild exophthalmos, + stare ENT:  no thyromegaly, no cervical lymphadenopathy Cardiovascular: RRR, No RG, +1/6 SEM Respiratory: CTA B Musculoskeletal: no deformities Skin:no rashes Neurological: no tremor with outstretched hands  ASSESSMENT: 1. Graves ds.  2.  Diplopia  3. Vit D deficiency  PLAN:  1. Patient with history of low TSH, diagnosed with Graves' disease based on elevated TSI antibodies.  She initially had thyrotoxic symptoms including weight loss, palpitations, tremors, anxiety, resolved after starting methimazole .   - We discussed in the past about possible consequences of uncontrolled thyroid  toxicosis to include arrhythmia, heart failure, increased coagulability, with an increased risk of stroke, PE, MI and sudden death -We initially started methimazole  and atenolol , however, she did not return for labs in the next TSH returned very high, at 47.  At that time, we decreased her methimazole  dose and ended up stopping methimazole  in 06/2018.  We had to restart methimazole  afterwards and stopped again in 09/2018.  We again had to restart methimazole  and stopped in 11/2019.  In 01/2020 we started again on a low-dose methimazole , 2.5 mg daily and at last visit we decreased the dose to 2.5 mg every other day.  She continued on this dose.  Due to variability on her TFTs we discussed that we needed to be very careful decreasing the  dose of methimazole  further.  I did previously suggest definitive treatment for Graves' disease with either RAI treatment or surgery and we discussed that this could be diet with a transaxillary approach.  Due to  the mild exophthalmos, she was a better candidate for surgery rather than RAI treatment.  However, she declined both. - At today's visit, she continues on 2.5 mg of methimazole  every other day - She continues to see Dr[facility] Geofm [facility]iding at [facility].  She has exophthalmia with diplopia with upward gaze.  She was tried on diclofenac and this helped.  She was able to come off afterwards.  Solu-Medrol and Tepezza were considered but were not needed.  She is on selenium 200 mcg daily and continues to follow-up with Dr. Riding.  Latest TSI's were still elevated at last visit, at 270, but improved.  We will recheck this today.  She tells me that she has been recently diagnosed with cataracts but no new, Graves' ophthalmopathy concerns. - She was previously able to come off atenolol  but she had to restart it due to high pulse and palpitations.  She is currently taking this on an as needed basis.   - At today's visit, she does not have thyrotoxic signs or symptoms - Recheck her TFTs today and change the methimazole  dose accordingly.  Will also add TSI antibodies. -I will see her back in 1 year, but with labs and 6 months  2.  Vit D def - She continues on 2000 units vitamin D  daily - No generalized joint pains - Latest vitamin D  level was normal on 03/05/2024: 41.5 per review of outside records  Orders Placed This Encounter  Procedures   TSH   T4, free   T3, free   Thyroid  stimulating immunoglobulin   Component     Latest Ref Rng 03/13/2024  Triiodothyronine,Free,Serum     2.3 - 4.2 pg/mL 3.1   T4,Free(Direct)     0.8 - 1.8 ng/dL 1.0   TSH     9.59 - 5.49 mIU/L 4.18   TSI     <140 % baseline 136    TSI's are finally normal! Thyroid  function tests are also normal, with a TSH higher in the target range.  She is now taking an extremely low dose of methimazole .  Will try to stop this completely especially as her TSI's are now normal.  Plan to recheck the tests in 1.5 months.  Lela Fendt, MD  PhD [facility] Endocrinology

## 2024-03-13 NOTE — Patient Instructions (Signed)
 Please continue methimazole 2.5 mg every other day.  Also, continue vitamin D 2000 units daily.  Please stop at the lab.  Please come back for a follow-up appointment in 1 year but for labs in 6 months.

## 2024-03-16 ENCOUNTER — Ambulatory Visit: Payer: Self-pay | Admitting: Internal Medicine

## 2024-03-16 ENCOUNTER — Other Ambulatory Visit: Payer: Self-pay | Admitting: Internal Medicine

## 2024-03-16 LAB — T3, FREE: T3, Free: 3.1 pg/mL (ref 2.3–4.2)

## 2024-03-16 LAB — T4, FREE: Free T4: 1 ng/dL (ref 0.8–1.8)

## 2024-03-16 LAB — TSH: TSH: 4.18 m[IU]/L (ref 0.40–4.50)

## 2024-03-16 LAB — THYROID STIMULATING IMMUNOGLOBULIN: TSI: 136 %{baseline}

## 2024-03-16 NOTE — Addendum Note (Signed)
 Addended by: TRIXIE FILE on: 03/16/2024 02:51 PM   Modules accepted: Orders

## 2024-03-30 ENCOUNTER — Other Ambulatory Visit: Payer: Self-pay | Admitting: Internal Medicine

## 2024-09-11 ENCOUNTER — Other Ambulatory Visit

## 2025-03-13 ENCOUNTER — Ambulatory Visit: Admitting: Internal Medicine
